# Patient Record
Sex: Male | Born: 1945 | Race: White | Hispanic: No | Marital: Married | Smoking: Former smoker
Health system: Southern US, Community
[De-identification: ages and names within clinical notes are randomized; demographics above are authoritative.]

## PROBLEM LIST (undated history)

## (undated) DIAGNOSIS — K5730 Diverticulosis of large intestine without perforation or abscess without bleeding: Secondary | ICD-10-CM

## (undated) DIAGNOSIS — I10 Essential (primary) hypertension: Secondary | ICD-10-CM

## (undated) DIAGNOSIS — Z9189 Other specified personal risk factors, not elsewhere classified: Secondary | ICD-10-CM

## (undated) DIAGNOSIS — Z87442 Personal history of urinary calculi: Secondary | ICD-10-CM

## (undated) DIAGNOSIS — Z8719 Personal history of other diseases of the digestive system: Secondary | ICD-10-CM

## (undated) DIAGNOSIS — I714 Abdominal aortic aneurysm, without rupture: Secondary | ICD-10-CM

## (undated) DIAGNOSIS — I7143 Infrarenal abdominal aortic aneurysm, without rupture: Secondary | ICD-10-CM

## (undated) DIAGNOSIS — R300 Dysuria: Secondary | ICD-10-CM

## (undated) DIAGNOSIS — N421 Congestion and hemorrhage of prostate: Secondary | ICD-10-CM

## (undated) DIAGNOSIS — Z8546 Personal history of malignant neoplasm of prostate: Secondary | ICD-10-CM

## (undated) DIAGNOSIS — N200 Calculus of kidney: Secondary | ICD-10-CM

## (undated) DIAGNOSIS — R3915 Urgency of urination: Secondary | ICD-10-CM

## (undated) HISTORY — PX: PERCUTANEOUS NEPHROSTOLITHOTOMY: SHX2207

## (undated) HISTORY — PX: OTHER SURGICAL HISTORY: SHX169

## (undated) HISTORY — PX: TONSILLECTOMY AND ADENOIDECTOMY: SUR1326

---

## 1998-06-11 ENCOUNTER — Ambulatory Visit (HOSPITAL_COMMUNITY): Admission: RE | Admit: 1998-06-11 | Discharge: 1998-06-11 | Payer: Self-pay | Admitting: Urology

## 1998-06-28 ENCOUNTER — Ambulatory Visit (HOSPITAL_COMMUNITY): Admission: RE | Admit: 1998-06-28 | Discharge: 1998-06-28 | Payer: Self-pay | Admitting: Urology

## 1998-07-11 ENCOUNTER — Ambulatory Visit (HOSPITAL_COMMUNITY): Admission: RE | Admit: 1998-07-11 | Discharge: 1998-07-11 | Payer: Self-pay | Admitting: Urology

## 1998-07-26 ENCOUNTER — Ambulatory Visit (HOSPITAL_COMMUNITY): Admission: RE | Admit: 1998-07-26 | Discharge: 1998-07-26 | Payer: Self-pay | Admitting: Urology

## 1998-07-26 ENCOUNTER — Encounter: Payer: Self-pay | Admitting: Urology

## 1998-09-09 ENCOUNTER — Encounter: Payer: Self-pay | Admitting: Urology

## 1998-09-09 ENCOUNTER — Ambulatory Visit (HOSPITAL_COMMUNITY): Admission: RE | Admit: 1998-09-09 | Discharge: 1998-09-09 | Payer: Self-pay | Admitting: Urology

## 1998-10-21 ENCOUNTER — Encounter: Payer: Self-pay | Admitting: Urology

## 1998-10-21 ENCOUNTER — Ambulatory Visit (HOSPITAL_COMMUNITY): Admission: RE | Admit: 1998-10-21 | Discharge: 1998-10-21 | Payer: Self-pay | Admitting: Urology

## 1998-11-07 ENCOUNTER — Encounter: Payer: Self-pay | Admitting: Urology

## 1998-11-07 ENCOUNTER — Ambulatory Visit (HOSPITAL_COMMUNITY): Admission: RE | Admit: 1998-11-07 | Discharge: 1998-11-07 | Payer: Self-pay | Admitting: Urology

## 1999-01-02 ENCOUNTER — Ambulatory Visit (HOSPITAL_COMMUNITY): Admission: RE | Admit: 1999-01-02 | Discharge: 1999-01-02 | Payer: Self-pay

## 1999-01-02 ENCOUNTER — Encounter: Payer: Self-pay | Admitting: Urology

## 1999-06-12 ENCOUNTER — Ambulatory Visit (HOSPITAL_COMMUNITY): Admission: RE | Admit: 1999-06-12 | Discharge: 1999-06-12 | Payer: Self-pay | Admitting: Urology

## 1999-07-10 ENCOUNTER — Ambulatory Visit (HOSPITAL_COMMUNITY): Admission: RE | Admit: 1999-07-10 | Discharge: 1999-07-10 | Payer: Self-pay | Admitting: Urology

## 1999-07-10 ENCOUNTER — Encounter: Payer: Self-pay | Admitting: Urology

## 1999-07-22 ENCOUNTER — Encounter: Payer: Self-pay | Admitting: Urology

## 1999-07-24 ENCOUNTER — Encounter: Payer: Self-pay | Admitting: Urology

## 1999-07-24 ENCOUNTER — Ambulatory Visit (HOSPITAL_COMMUNITY): Admission: RE | Admit: 1999-07-24 | Discharge: 1999-07-24 | Payer: Self-pay | Admitting: Urology

## 1999-08-01 ENCOUNTER — Encounter: Payer: Self-pay | Admitting: Urology

## 1999-08-01 ENCOUNTER — Ambulatory Visit (HOSPITAL_COMMUNITY): Admission: RE | Admit: 1999-08-01 | Discharge: 1999-08-01 | Payer: Self-pay | Admitting: Urology

## 2000-03-25 ENCOUNTER — Ambulatory Visit (HOSPITAL_COMMUNITY): Admission: RE | Admit: 2000-03-25 | Discharge: 2000-03-25 | Payer: Self-pay | Admitting: Urology

## 2000-03-25 ENCOUNTER — Encounter: Payer: Self-pay | Admitting: Urology

## 2000-03-30 ENCOUNTER — Ambulatory Visit (HOSPITAL_COMMUNITY): Admission: RE | Admit: 2000-03-30 | Discharge: 2000-03-30 | Payer: Self-pay | Admitting: Urology

## 2000-03-30 ENCOUNTER — Encounter: Payer: Self-pay | Admitting: Urology

## 2000-05-12 ENCOUNTER — Encounter: Payer: Self-pay | Admitting: Urology

## 2000-05-13 ENCOUNTER — Encounter: Payer: Self-pay | Admitting: Urology

## 2000-05-13 ENCOUNTER — Ambulatory Visit (HOSPITAL_COMMUNITY): Admission: RE | Admit: 2000-05-13 | Discharge: 2000-05-13 | Payer: Self-pay | Admitting: Urology

## 2000-05-27 ENCOUNTER — Encounter: Payer: Self-pay | Admitting: Urology

## 2000-05-27 ENCOUNTER — Ambulatory Visit (HOSPITAL_COMMUNITY): Admission: RE | Admit: 2000-05-27 | Discharge: 2000-05-27 | Payer: Self-pay | Admitting: Urology

## 2000-07-15 ENCOUNTER — Ambulatory Visit (HOSPITAL_COMMUNITY): Admission: RE | Admit: 2000-07-15 | Discharge: 2000-07-15 | Payer: Self-pay | Admitting: Urology

## 2000-07-15 ENCOUNTER — Encounter: Payer: Self-pay | Admitting: Urology

## 2000-09-15 ENCOUNTER — Encounter: Payer: Self-pay | Admitting: Urology

## 2000-09-15 ENCOUNTER — Ambulatory Visit (HOSPITAL_COMMUNITY): Admission: RE | Admit: 2000-09-15 | Discharge: 2000-09-15 | Payer: Self-pay | Admitting: Urology

## 2001-03-30 ENCOUNTER — Encounter: Payer: Self-pay | Admitting: Urology

## 2001-03-30 ENCOUNTER — Ambulatory Visit (HOSPITAL_COMMUNITY): Admission: RE | Admit: 2001-03-30 | Discharge: 2001-03-30 | Payer: Self-pay | Admitting: Urology

## 2001-05-17 ENCOUNTER — Encounter: Payer: Self-pay | Admitting: Urology

## 2001-05-19 ENCOUNTER — Ambulatory Visit (HOSPITAL_COMMUNITY): Admission: RE | Admit: 2001-05-19 | Discharge: 2001-05-19 | Payer: Self-pay | Admitting: Urology

## 2001-05-19 ENCOUNTER — Encounter: Payer: Self-pay | Admitting: Urology

## 2001-07-21 ENCOUNTER — Encounter: Payer: Self-pay | Admitting: Urology

## 2001-07-21 ENCOUNTER — Ambulatory Visit (HOSPITAL_COMMUNITY): Admission: RE | Admit: 2001-07-21 | Discharge: 2001-07-21 | Payer: Self-pay | Admitting: Urology

## 2001-08-18 ENCOUNTER — Encounter: Payer: Self-pay | Admitting: Urology

## 2001-08-22 ENCOUNTER — Encounter: Payer: Self-pay | Admitting: Urology

## 2001-08-22 ENCOUNTER — Ambulatory Visit (HOSPITAL_COMMUNITY): Admission: RE | Admit: 2001-08-22 | Discharge: 2001-08-22 | Payer: Self-pay | Admitting: Urology

## 2001-10-27 ENCOUNTER — Encounter: Payer: Self-pay | Admitting: Urology

## 2001-10-27 ENCOUNTER — Ambulatory Visit (HOSPITAL_COMMUNITY): Admission: RE | Admit: 2001-10-27 | Discharge: 2001-10-27 | Payer: Self-pay | Admitting: Urology

## 2001-11-10 ENCOUNTER — Ambulatory Visit (HOSPITAL_COMMUNITY): Admission: RE | Admit: 2001-11-10 | Discharge: 2001-11-10 | Payer: Self-pay | Admitting: Urology

## 2001-11-10 ENCOUNTER — Encounter: Payer: Self-pay | Admitting: Urology

## 2002-03-15 ENCOUNTER — Encounter: Payer: Self-pay | Admitting: Urology

## 2002-03-15 ENCOUNTER — Ambulatory Visit (HOSPITAL_COMMUNITY): Admission: RE | Admit: 2002-03-15 | Discharge: 2002-03-15 | Payer: Self-pay | Admitting: Urology

## 2002-03-23 ENCOUNTER — Ambulatory Visit (HOSPITAL_BASED_OUTPATIENT_CLINIC_OR_DEPARTMENT_OTHER): Admission: RE | Admit: 2002-03-23 | Discharge: 2002-03-23 | Payer: Self-pay | Admitting: Urology

## 2002-05-18 ENCOUNTER — Encounter: Payer: Self-pay | Admitting: Urology

## 2002-05-18 ENCOUNTER — Ambulatory Visit (HOSPITAL_BASED_OUTPATIENT_CLINIC_OR_DEPARTMENT_OTHER): Admission: RE | Admit: 2002-05-18 | Discharge: 2002-05-18 | Payer: Self-pay | Admitting: Urology

## 2002-07-24 ENCOUNTER — Encounter: Payer: Self-pay | Admitting: Urology

## 2002-07-27 ENCOUNTER — Encounter: Payer: Self-pay | Admitting: Urology

## 2002-07-27 ENCOUNTER — Ambulatory Visit (HOSPITAL_BASED_OUTPATIENT_CLINIC_OR_DEPARTMENT_OTHER): Admission: RE | Admit: 2002-07-27 | Discharge: 2002-07-27 | Payer: Self-pay | Admitting: Urology

## 2002-08-10 ENCOUNTER — Encounter: Payer: Self-pay | Admitting: Urology

## 2002-08-10 ENCOUNTER — Encounter: Admission: RE | Admit: 2002-08-10 | Discharge: 2002-08-10 | Payer: Self-pay | Admitting: Urology

## 2002-10-05 ENCOUNTER — Encounter: Payer: Self-pay | Admitting: Urology

## 2002-10-05 ENCOUNTER — Encounter: Admission: RE | Admit: 2002-10-05 | Discharge: 2002-10-05 | Payer: Self-pay | Admitting: Urology

## 2003-03-15 ENCOUNTER — Ambulatory Visit (HOSPITAL_BASED_OUTPATIENT_CLINIC_OR_DEPARTMENT_OTHER): Admission: RE | Admit: 2003-03-15 | Discharge: 2003-03-15 | Payer: Self-pay | Admitting: Urology

## 2003-03-15 ENCOUNTER — Encounter: Payer: Self-pay | Admitting: Urology

## 2003-05-10 ENCOUNTER — Ambulatory Visit (HOSPITAL_BASED_OUTPATIENT_CLINIC_OR_DEPARTMENT_OTHER): Admission: RE | Admit: 2003-05-10 | Discharge: 2003-05-10 | Payer: Self-pay | Admitting: Urology

## 2003-05-10 ENCOUNTER — Encounter: Payer: Self-pay | Admitting: Urology

## 2003-06-07 ENCOUNTER — Ambulatory Visit (HOSPITAL_BASED_OUTPATIENT_CLINIC_OR_DEPARTMENT_OTHER): Admission: RE | Admit: 2003-06-07 | Discharge: 2003-06-07 | Payer: Self-pay | Admitting: Urology

## 2003-06-07 ENCOUNTER — Encounter: Payer: Self-pay | Admitting: Urology

## 2003-07-05 ENCOUNTER — Ambulatory Visit (HOSPITAL_BASED_OUTPATIENT_CLINIC_OR_DEPARTMENT_OTHER): Admission: RE | Admit: 2003-07-05 | Discharge: 2003-07-05 | Payer: Self-pay | Admitting: Urology

## 2003-08-17 ENCOUNTER — Ambulatory Visit (HOSPITAL_COMMUNITY): Admission: RE | Admit: 2003-08-17 | Discharge: 2003-08-17 | Payer: Self-pay | Admitting: Urology

## 2003-08-17 ENCOUNTER — Ambulatory Visit (HOSPITAL_BASED_OUTPATIENT_CLINIC_OR_DEPARTMENT_OTHER): Admission: RE | Admit: 2003-08-17 | Discharge: 2003-08-17 | Payer: Self-pay | Admitting: Urology

## 2003-10-18 ENCOUNTER — Ambulatory Visit (HOSPITAL_COMMUNITY): Admission: RE | Admit: 2003-10-18 | Discharge: 2003-10-18 | Payer: Self-pay | Admitting: Urology

## 2004-01-17 ENCOUNTER — Ambulatory Visit (HOSPITAL_COMMUNITY): Admission: RE | Admit: 2004-01-17 | Discharge: 2004-01-17 | Payer: Self-pay | Admitting: Urology

## 2004-02-28 ENCOUNTER — Ambulatory Visit (HOSPITAL_COMMUNITY): Admission: RE | Admit: 2004-02-28 | Discharge: 2004-02-28 | Payer: Self-pay | Admitting: Urology

## 2004-09-25 ENCOUNTER — Ambulatory Visit (HOSPITAL_COMMUNITY): Admission: RE | Admit: 2004-09-25 | Discharge: 2004-09-25 | Payer: Self-pay | Admitting: Urology

## 2004-10-24 ENCOUNTER — Ambulatory Visit (HOSPITAL_BASED_OUTPATIENT_CLINIC_OR_DEPARTMENT_OTHER): Admission: RE | Admit: 2004-10-24 | Discharge: 2004-10-24 | Payer: Self-pay | Admitting: Urology

## 2004-11-20 ENCOUNTER — Ambulatory Visit (HOSPITAL_COMMUNITY): Admission: RE | Admit: 2004-11-20 | Discharge: 2004-11-20 | Payer: Self-pay | Admitting: Urology

## 2005-01-15 ENCOUNTER — Ambulatory Visit (HOSPITAL_COMMUNITY): Admission: RE | Admit: 2005-01-15 | Discharge: 2005-01-15 | Payer: Self-pay | Admitting: Urology

## 2005-02-04 HISTORY — PX: TRANSURETHRAL RESECTION OF PROSTATE: SHX73

## 2005-02-20 ENCOUNTER — Inpatient Hospital Stay (HOSPITAL_COMMUNITY): Admission: RE | Admit: 2005-02-20 | Discharge: 2005-02-22 | Payer: Self-pay | Admitting: Urology

## 2005-02-20 ENCOUNTER — Encounter (INDEPENDENT_AMBULATORY_CARE_PROVIDER_SITE_OTHER): Payer: Self-pay | Admitting: *Deleted

## 2005-02-22 ENCOUNTER — Emergency Department (HOSPITAL_COMMUNITY): Admission: EM | Admit: 2005-02-22 | Discharge: 2005-02-22 | Payer: Self-pay | Admitting: Emergency Medicine

## 2005-05-22 ENCOUNTER — Ambulatory Visit (HOSPITAL_COMMUNITY): Admission: RE | Admit: 2005-05-22 | Discharge: 2005-05-22 | Payer: Self-pay | Admitting: Urology

## 2005-05-22 ENCOUNTER — Ambulatory Visit (HOSPITAL_BASED_OUTPATIENT_CLINIC_OR_DEPARTMENT_OTHER): Admission: RE | Admit: 2005-05-22 | Discharge: 2005-05-22 | Payer: Self-pay | Admitting: Urology

## 2005-06-04 ENCOUNTER — Ambulatory Visit (HOSPITAL_COMMUNITY): Admission: RE | Admit: 2005-06-04 | Discharge: 2005-06-04 | Payer: Self-pay | Admitting: Urology

## 2005-09-23 IMAGING — CR DG CHEST 2V
2 series · 2 of 2 positions shown · non-contrast
Comparison: 03/15/03.

CLINICAL DATA: Preop for renal calculi. 
 CHEST TWO VIEW

[view not recorded (1 of 2)]
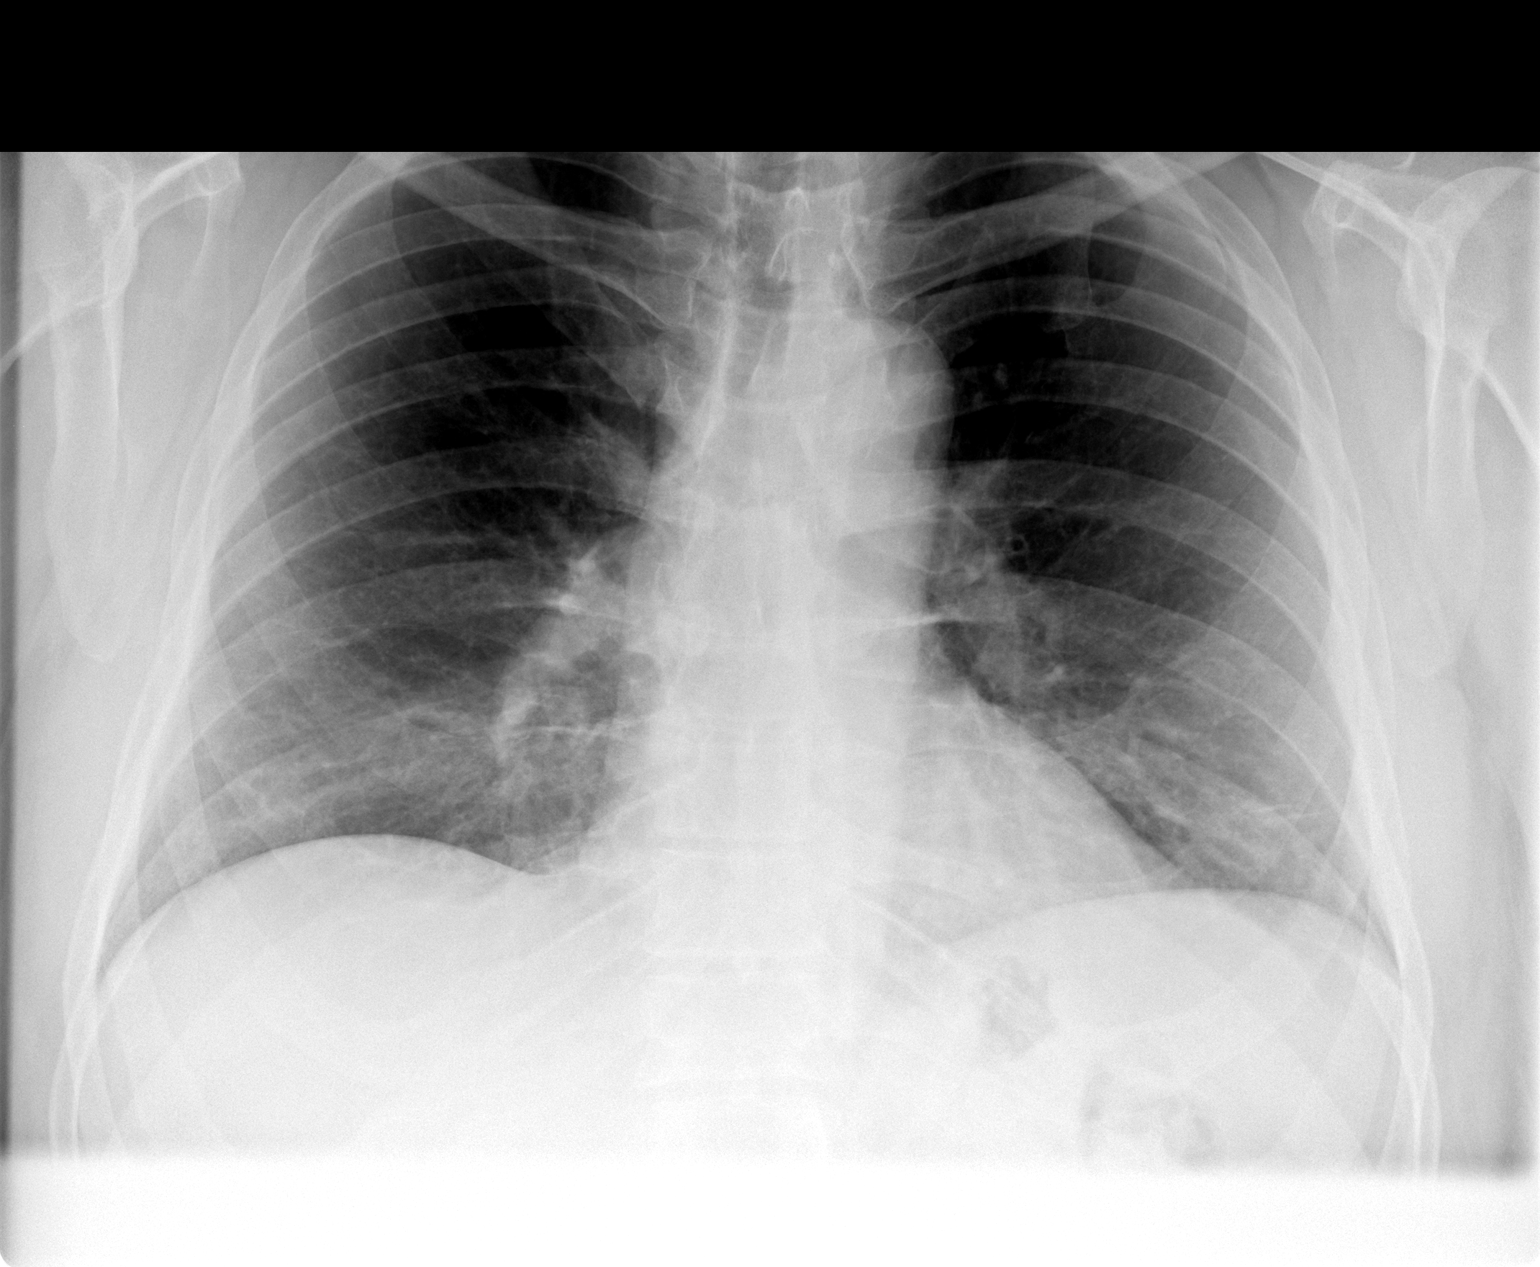

[view not recorded (2 of 2)]
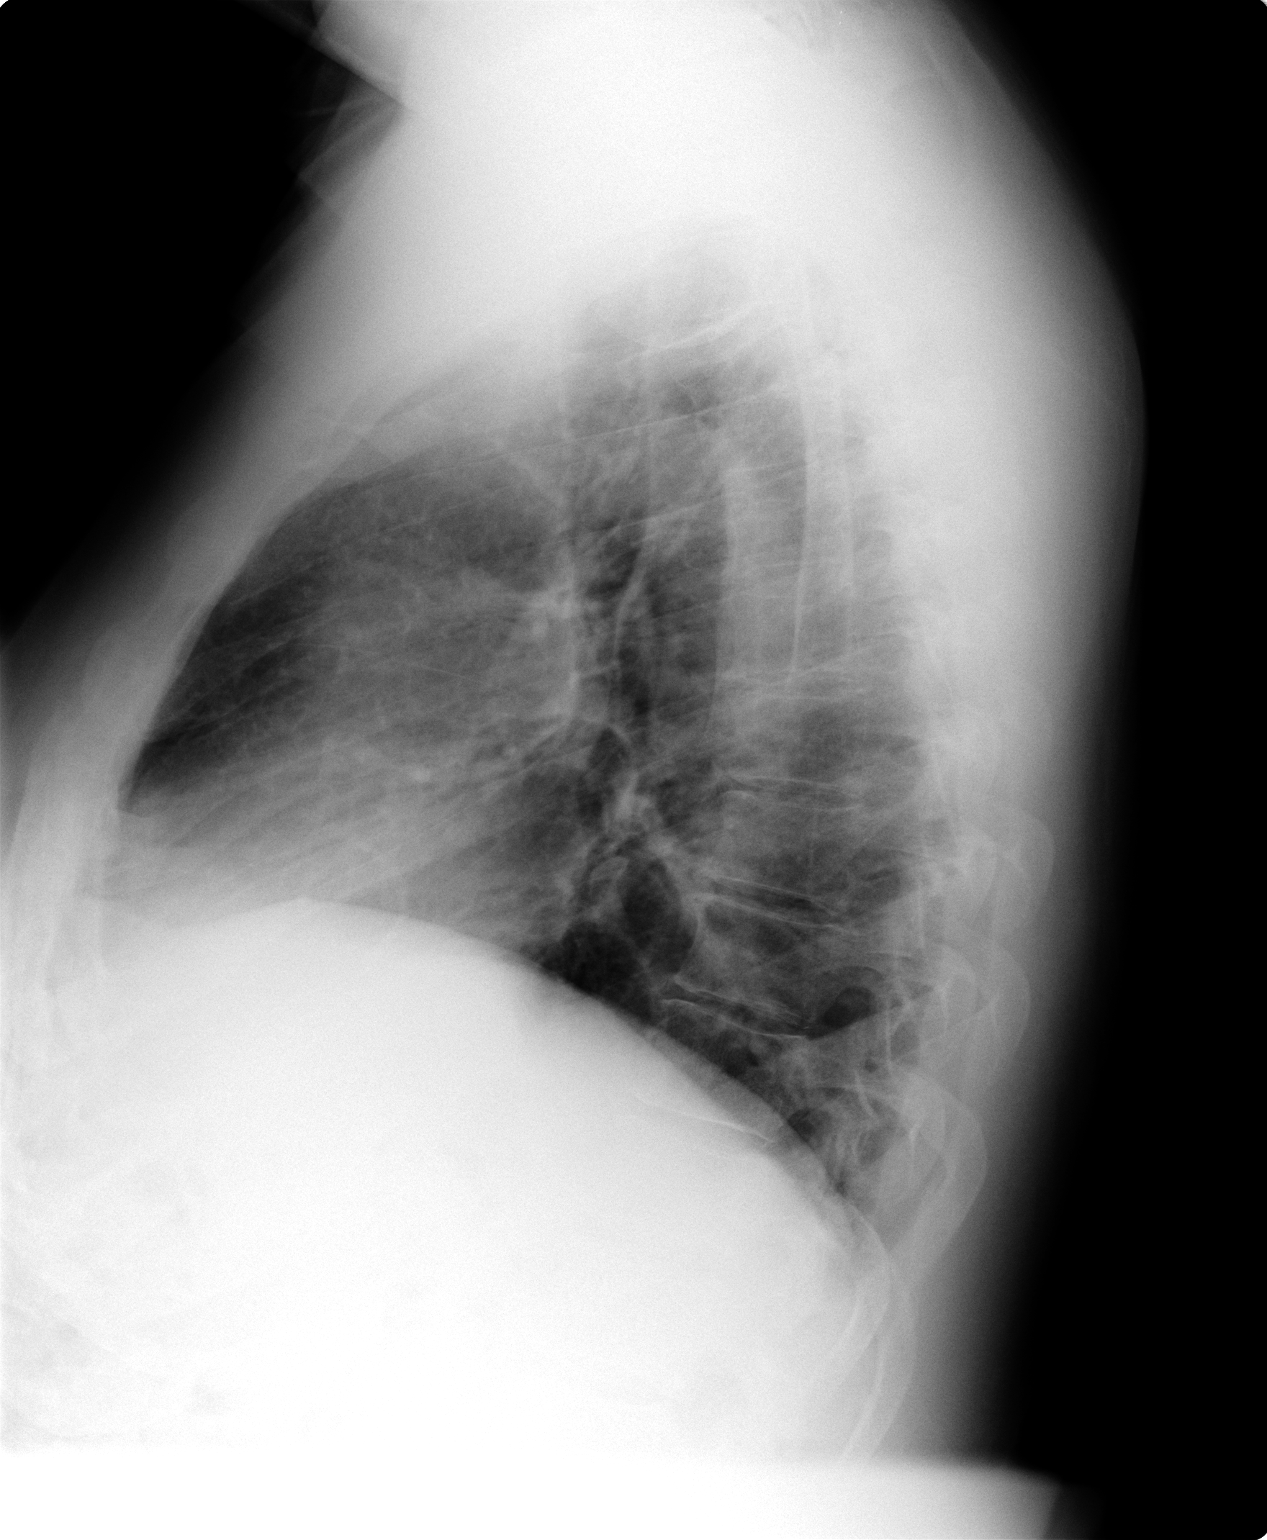

[2 of 2 positions shown; findings below may reference images not displayed]

Heart size is normal.  There is no heart failure.  There is mild bibasilar atelectasis / scarring.  There is no infiltrate or effusion.  
 IMPRESSION
 Bibasilar atelectasis / scarring similar to the prior study.  No acute abnormality.

## 2006-06-03 IMAGING — CR DG ABDOMEN 1V
3 series · 3 of 3 positions shown · non-contrast
Comparison: 05/22/05.

CLINICAL DATA: The patient had lithotripsy today with ureteral stents.
 ABDOMEN ? 1 VIEW:

[view not recorded (1 of 3)]
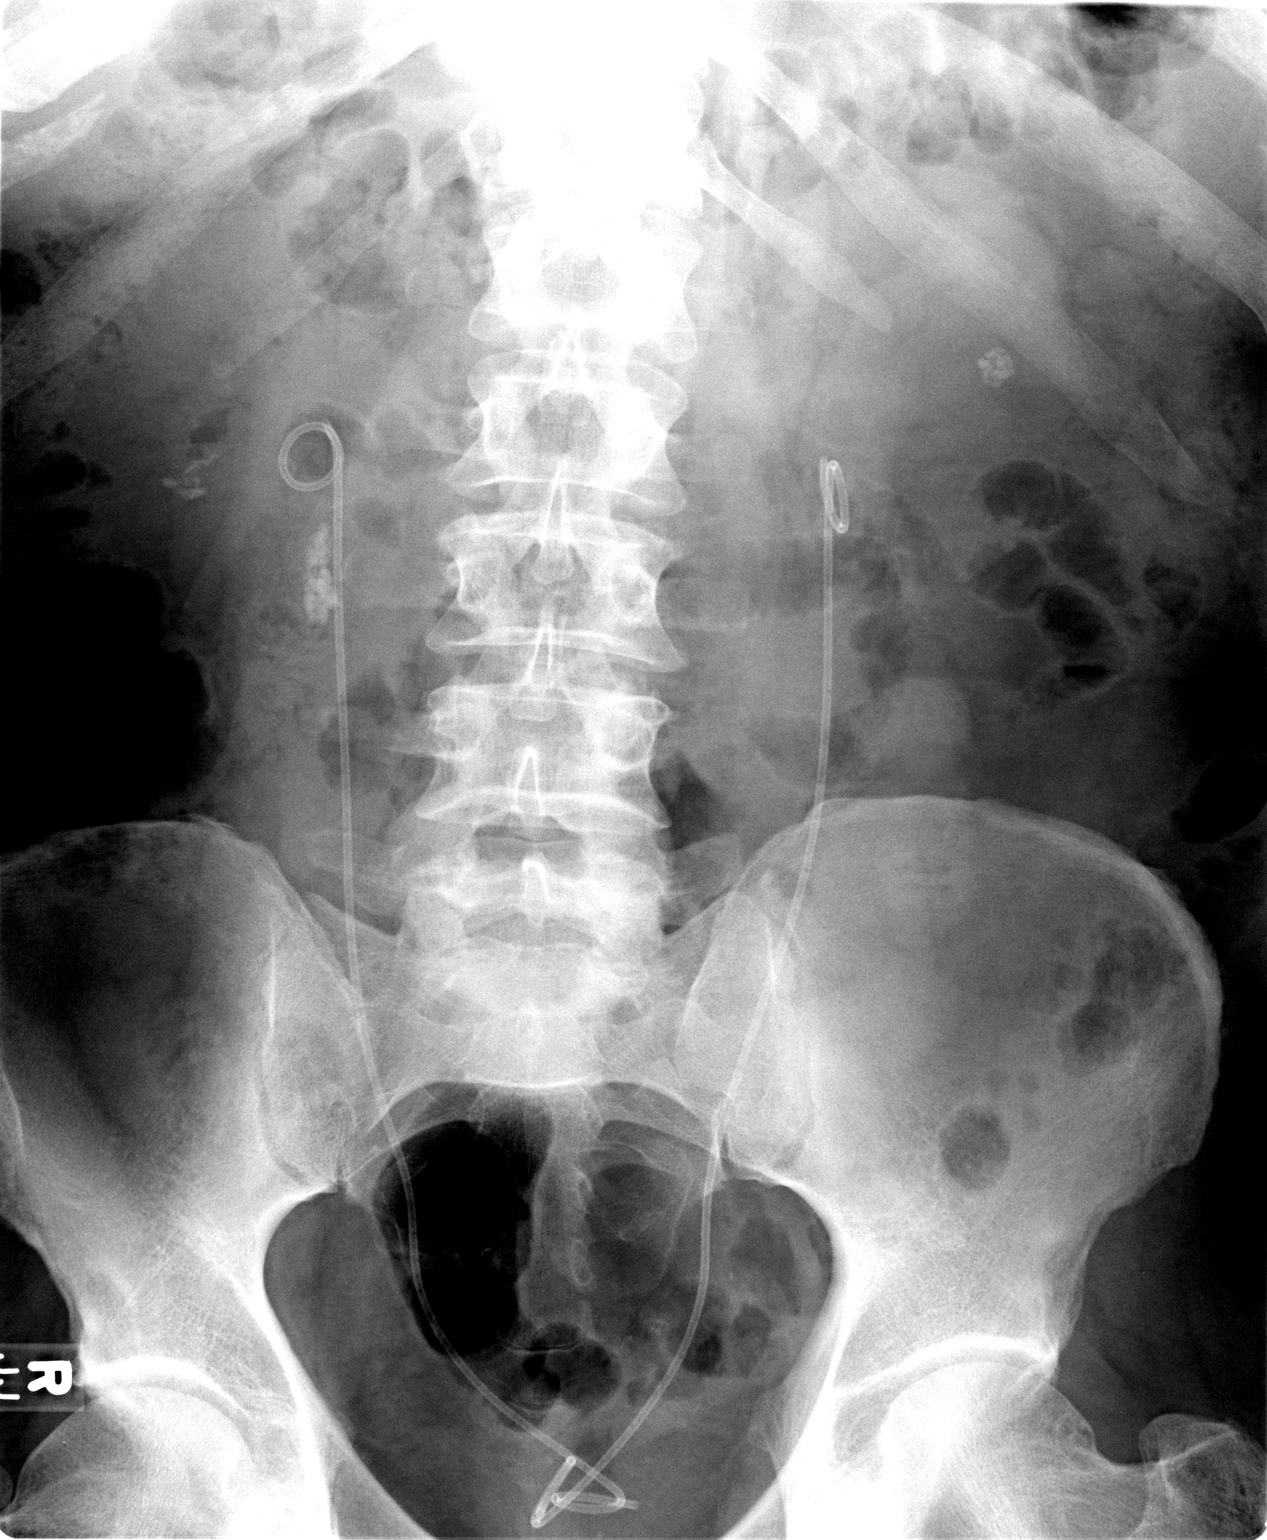

[view not recorded (2 of 3)]
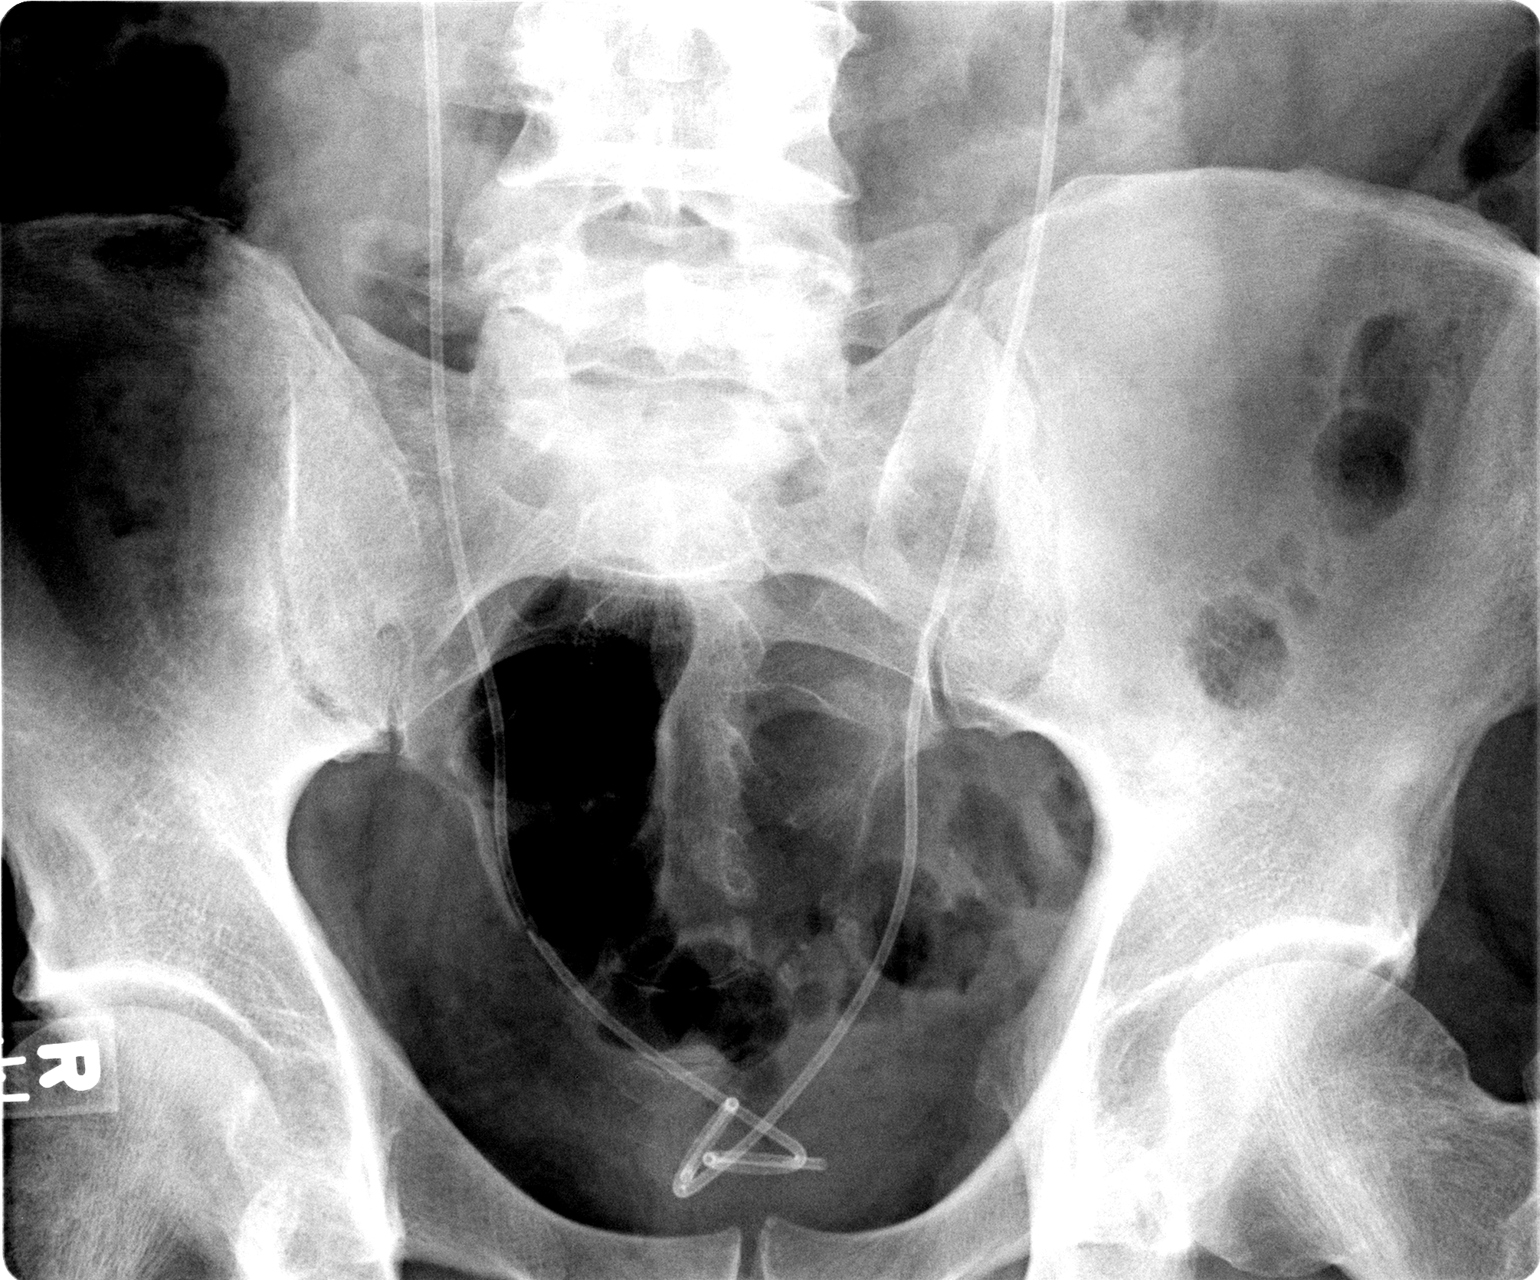

[view not recorded (3 of 3)]
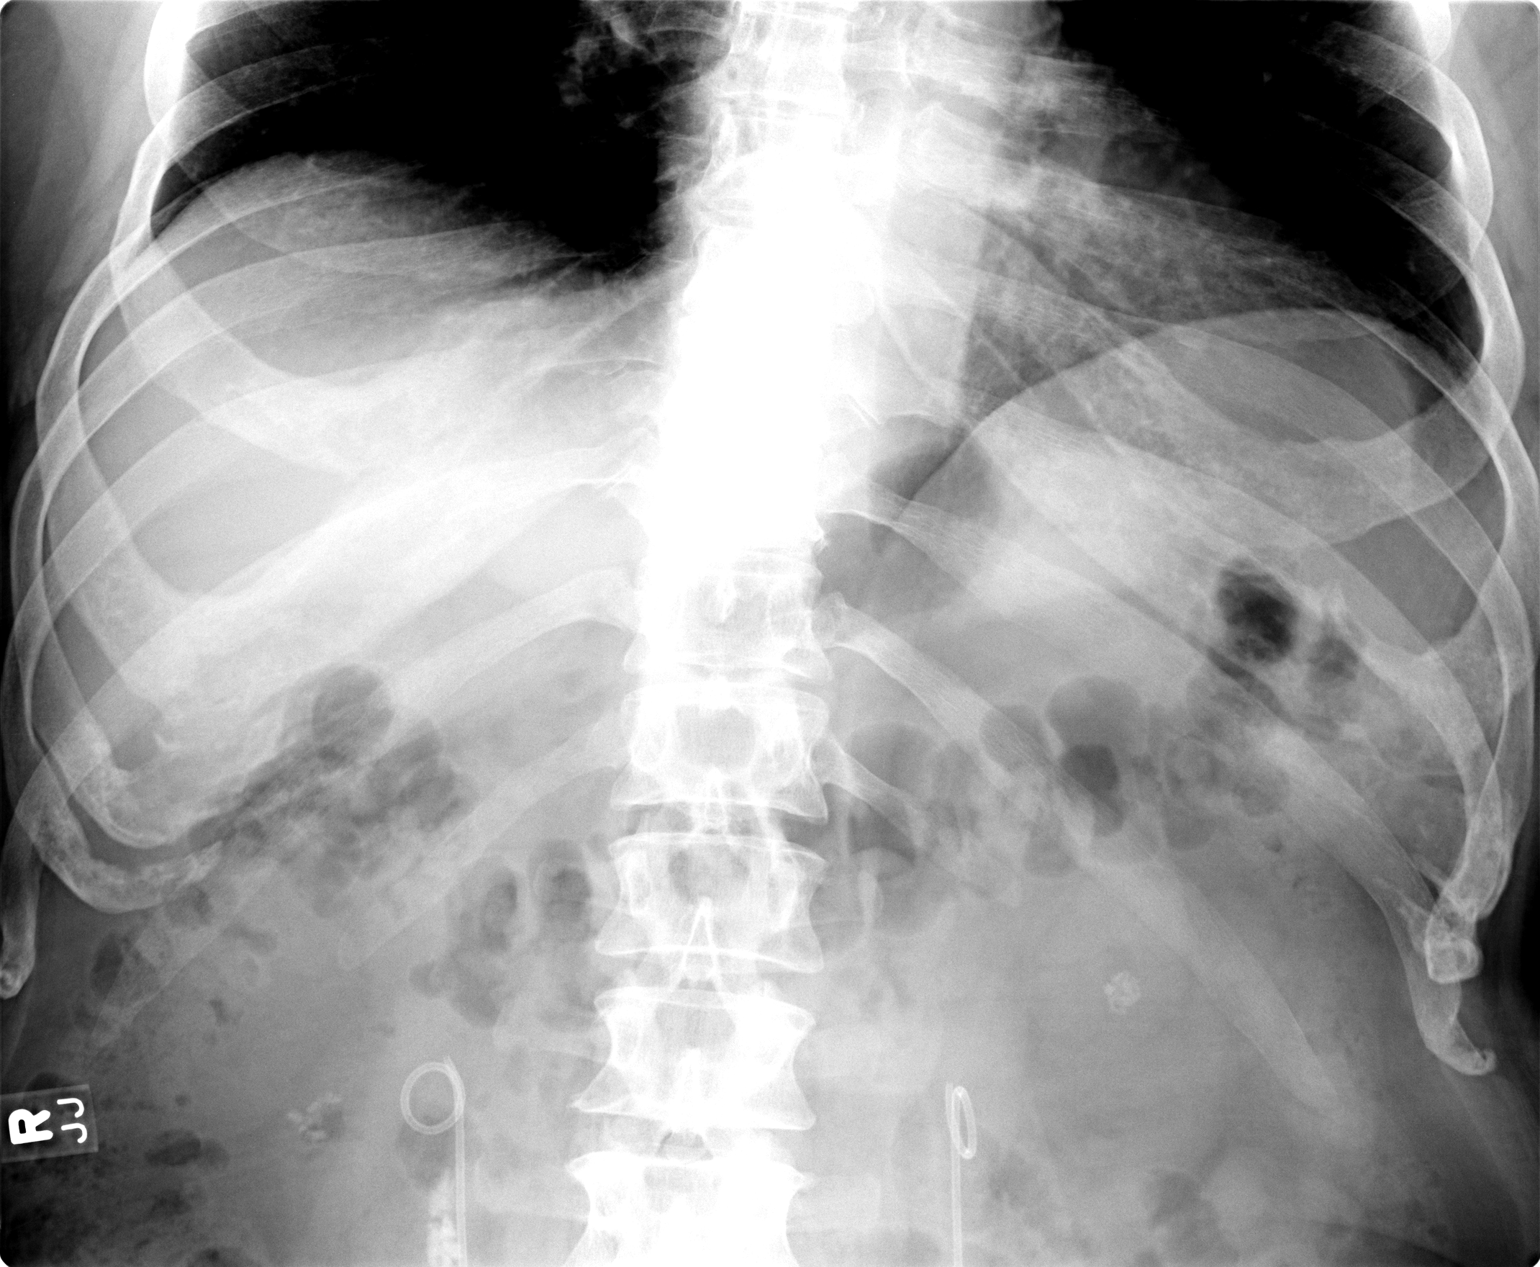

[3 of 3 positions shown; findings below may reference images not displayed]

FINDINGS: AP view of the abdomen reveals bilateral ureteral stents.  A cluster of calcifications are noted in the proximal right ureter.  This calcification was thought to be in the lower pole of the right kidney.  Calcifications remain in the left kidney.
IMPRESSION: Bilateral ureteral stents with a cluster of calcifications in the proximal right ureter.

## 2006-10-01 ENCOUNTER — Ambulatory Visit (HOSPITAL_BASED_OUTPATIENT_CLINIC_OR_DEPARTMENT_OTHER): Admission: RE | Admit: 2006-10-01 | Discharge: 2006-10-01 | Payer: Self-pay | Admitting: Urology

## 2008-01-30 HISTORY — PX: HEMICOLECTOMY: SHX854

## 2008-03-02 ENCOUNTER — Ambulatory Visit (HOSPITAL_COMMUNITY): Admission: RE | Admit: 2008-03-02 | Discharge: 2008-03-02 | Payer: Self-pay | Admitting: Urology

## 2011-04-21 NOTE — Op Note (Signed)
NAME:  Peter Jackson, Peter Jackson NO.:  [id number]   MEDICAL RECORD NO.:  [id number]          PATIENT TYPE:  AMB   LOCATION:  DAY                          FACILITY:  [facility]   PHYSICIAN:  Lindaann Slough, M.D.  DATE OF BIRTH:  [birth year]   DATE OF PROCEDURE:  03/02/2008  DATE OF DISCHARGE:                               OPERATIVE REPORT   PREOPERATIVE DIAGNOSIS:  Bladder stone.   POSTOPERATIVE DIAGNOSIS:  Bladder stone.   PROCEDURE:  1. Cystoscopy.  2. Electrohydraulic lithotripsy of bladder stone and stone extraction.   SURGEON:  Danae Chen, M.D.   ANESTHESIA:  General.   INDICATIONS:  The patient is 55 years of male who has a long history of  kidney stones.  For the past several weeks, he had been having flank  pain on and off.  About a week ago, he started having difficulty  voiding.  He was voiding a small amount of urine at the time and having  dribbling.  He came to the office on February 27, 2008.  He stated that he  thought that he had a stone in his bladder that was trying to pass, but  that it was blocking his urethra.  Cystoscopy in the office showed a  stone in the prostatic urethra that was pushed back in the bladder and  he continued to have the same symptoms.  He is scheduled today for EHL  of the bladder stone and stone extraction.   DESCRIPTION OF PROCEDURE:  Under general anesthesia, the patient was  prepped and draped and placed in the dorsal lithotomy position.  A #22  panendoscope was inserted in the urethra.  The anterior urethra was  normal.  There was a large stone in the prostatic urethra.  The patient  had previous TURP and the bladder neck was opened.  The stone was pushed  back in the bladder.  There was no tumor in the bladder.  The ureteral  orifices were in normal position and shape.  Then with the EHL probe,  the stone was fragmented.  Then the stone fragments were extracted with  the nitinol basket.  Then the bladder was irrigated with  normal saline  and smaller stone fragments were irrigated out.  There were some smaller  stone fragments left in the bladder, but they are small enough for the  patient to pass them spontaneously.  The bladder was then emptied and  the cystoscope removed.   He tolerated the procedure well and left the OR in satisfactory  condition to post anesthesia care unit.      Lindaann Slough, M.D.  Electronically Signed     MN/MEDQ  D:  03/02/2008  T:  03/03/2008  Job:  [id number]

## 2011-04-24 NOTE — Op Note (Signed)
NAME:  KEASTON, PILE NO.:  [id number]   MEDICAL RECORD NO.:  [id number]          PATIENT TYPE:  AMB   LOCATION:  NESC                         FACILITY:  [facility]   PHYSICIAN:  Mark C. Vernie Ammons, M.D.  DATE OF BIRTH:  07/06/1946   DATE OF PROCEDURE:  05/22/2005  DATE OF DISCHARGE:                                 OPERATIVE REPORT   PREOPERATIVE DIAGNOSIS:  Bilateral renal calculi.   POSTOPERATIVE DIAGNOSIS:  Bilateral renal calculi.   PROCEDURE:  Cystoscopy, bilateral retrograde pyelograms with interpretation,  bilateral ureteroscopy, bilateral stone extraction, and right in situ laser  lithotripsy of stones.   SURGEON:  Dr. Vernie Ammons.   ANESTHESIA:  General.   BLOOD LOSS:  Minimal.   DRAINS:  6-French 26 cm double-J stents in both right and left ureters (no  string).   SPECIMENS:  Stone to patient.   COMPLICATIONS:  None.   INDICATIONS:  The patient is a 66 year old white male with bilateral renal  calculi. He has had lithotripsy on the stones before. He wanted to proceed  with attempted ureteroscopic extraction rather than a percutaneous  procedure. We discussed the risks, complications and alternatives as well as  the limitations to this procedure, and he understands and elected to  proceed.   DESCRIPTION OF OPERATION:  After informed consent, the patient brought to  the major OR, placed on the table, administered general anesthesia, then  moved to the dorsal lithotomy position. His genitalia was sterilely prepped  and draped and initially, a 21-French cystoscope was introduced. The urethra  was noted be normal. The prostatic urethra recess was well resected, and the  bladder was free of any tumors or inflammatory lesions. On the bed of the  prostate was a single stone that was adherent to the prostatic urethra. This  was dislodged and pushed back in the bladder and later extracted.   The left orifice was identified, and a guidewire was passed up the  left  ureter under direct fluoroscopy. A ureteral access sheath was then placed up  the left ureter also under fluoroscopic control. I then passed the 6-French  flexible ureteroscope through the access sheath into the renal pelvis and  then first retroflexed the scope and was able to get it into the lower pole  where I was able to grasp multiple stones with a nitinol basket and extract  them one at that time, pulling each stone that I grasped out through the  access sheath leaving the access sheath in place. I performed this until I  had what appeared to be a nearly all of the stones. There may have been one  small fragment left, but there were some blood clots in that location, and I  could see no further stones at that time.   I then directed my attention to the stones in the mid pole. Due to his  anatomy and a very tight infundibulum which was demonstrated on retrograde  pyelogram, this was a very extremely difficult location to negotiate the  scope. I had injected contrast through the ureteroscope while it was in the  renal pelvis to  outline the anatomy of the renal pelvis and found that the  infundibulum portending the mid middle pole where the stones were located  curved upward and also was very narrow which would explain why the  fragmented stones did not pass. I used fluoroscopy as well as direct  visualization to negotiate the scope into the infundibulum and out into the  calix where the stones were located. I attempted to grasp the stones  multiple times but was unsuccessful to any significant degree. Since I was  unable to get any further stones from that infundibulum, I felt it was wise  to just place a stent at this point. I therefore removed the ureteroscope  and passed a guidewire through the access sheath, removing the access sheath  and then backloading the cystoscope. The double-J stent was then passed over  guidewire into the renal pelvis, and the guidewire was removed  with good  curl being noted in the renal pelvis and in the bladder.   An identical procedure was performed on the right side, first passing the  cystoscope and the guidewire then the access sheath and then finally the  ureteroscope through the access sheath. Again, I was able to pass the scope  up to the upper pole where fragments were located, and some were extracted  with great difficulty due to the anatomy. The infundibulum that the stones  were located in was off the side of the main upper pole calix at an acute  angle. I was able to extract some of the stone but not all that. I then  directed my attention to the lower pole and noted a single very large stone  and multiple small fragments in the lower pole upon retroflexion of the  scope. I attempted to engage the stone in the nitinol basket, but it was too  large. I therefore passed 200 micron holmium laser fiber through the  ureteroscope and fragmented the stone. I was able to grasp two portions of  it and pull it into the renal pelvis, but they were too large to extract. I  felt leaving them in the renal pelvis and placing a stent was the best  course at this point since I had been working for several hours and I did  not want to expose the patient to further fluoroscopy. I therefore placed a  double-J stent in the identical fashion that I did on the contralateral  side. I then drained the bladder, and the patient received a B&O  suppository. He was awakened and taken to the recovery in stable  satisfactory condition.   This procedure would be considered a complex procedure, requiring much  longer duration as well as extremely difficult due to the anatomy of this  patient's collecting system. Both of these factors made this procedure  complex, difficult, lengthy.   The patient will be given a prescription for Pyridium plus and Vicodin HP.  He will then follow-up my office in 1 week.      MCO/MEDQ  D:  05/22/2005  T:   05/22/2005  Job:  [id number]

## 2011-04-24 NOTE — Op Note (Signed)
NAME:  Peter Jackson, Peter Jackson NO.:  [id number]   MEDICAL RECORD NO.:  [id number]          PATIENT TYPE:  INP   LOCATION:  0003                         FACILITY:  [facility]   PHYSICIAN:  Lindaann Slough, M.D.  DATE OF BIRTH:  August 16, 1946   DATE OF PROCEDURE:  02/20/2005  DATE OF DISCHARGE:                                 OPERATIVE REPORT   PREOPERATIVE DIAGNOSES:  1.  Benign prostatic hypertrophy.  2.  Bladder outlet obstruction.  3.  Bladder calculi.   POSTOPERATIVE DIAGNOSES:  1.  Benign prostatic hypertrophy.  2.  Bladder outlet obstruction.  3.  Bladder calculi.   PROCEDURE DONE:  1.  Cystoscopy.  2.  TURP.  3.  Removal of bladder calculi.   SURGEON:  Dr. Brunilda Payor   ANESTHESIA:  General.   INDICATION:  The patient is a 65 year old male, who had had increasing  symptoms of frequency, urgency, hesitancy, and straining on urination.  He  was treated with Cardura; however, he continued to have the same symptoms.  He has had several ESL for renal calculi, and he has several stones in his  bladder, and he has been unable to pass them.  Cystoscopy showed trilobar  prostatic hypertrophy.  He is scheduled today for cystoscopy TURP, and  removal of bladder calculi.   Under general anesthesia, the patient was prepped and draped and placed in  the dorsal lithotomy position.  A #22 Wappler cystoscope was inserted in the  bladder.  The patient has trilobar prostatic hypertrophy with obstruction of  the bladder neck.  The bladder is trabeculated.  There are 2 stones in the  bladder.  The ureteral orifices are in normal position and shape with clear  efflux.  The cystoscope was removed.  The urethra was then dilated with #30  Hal Morales sounds.  Then, a #28 continuous-flow resectoscope was  inserted in the bladder.  Resection of the prostate gland was done between  the 5 o'clock and the 7 o'clock position, then between the 2 o'clock and the  5 o'clock position and between  the 7 and the 10 o'clock position using the  bladder neck and the verumontanum as landmarks.  Then the resection was  completed between the 10 o'clock and the 2 o'clock position using the same  landmarks.  Hemostasis was secured with electrocautery.  The prostatic chips  were then irrigated out of the bladder.  The resectoscope was then removed;  there was minimal bleeding at the end of the procedure.  With the  resectoscope at the verumontanum, the bladder neck was wide open.  The  resectoscope was then removed.  The cystoscope was reinserted in the  bladder, and there were some blood clots in the bladder that made it  difficult to break the stones with EHL.  The cystoscope was then removed.  The resectoscope was then reinserted in the bladder, and I was able to  remove the two bladder stones with the resectoscope through the urethra.  Hemostasis was then completed with  electrocautery.  There was minimal bleeding at the end of the procedure.  The resectoscope was  then removed.  A #24 Foley catheter was then inserted  in the bladder.   The patient tolerated the procedure well and left the OR in satisfactory  condition to postanesthesia care unit.      MN/MEDQ  D:  02/20/2005  T:  02/20/2005  Job:  [id number]

## 2011-04-24 NOTE — Op Note (Signed)
NAME:  Peter Jackson, Peter Jackson NO.:  [id number]   MEDICAL RECORD NO.:  [id number]          PATIENT TYPE:  AMB   LOCATION:  NESC                         FACILITY:  [facility]   PHYSICIAN:  Lindaann Slough, M.D.  DATE OF BIRTH:  August 27, 1946   DATE OF PROCEDURE:  10/01/2006  DATE OF DISCHARGE:                                 OPERATIVE REPORT   PREOPERATIVE DIAGNOSIS:  Right ureteral stones.   POSTOPERATIVE DIAGNOSIS:  Right ureteral stones.   PROCEDURE DONE:  1. Cystoscopy.  2. Right retrograde pyelogram.  3. Ureteroscopy.  4. Homium laser of ureteral stones and stone extraction.  5. Insertion of double-J catheter.   SURGEON:  Danae Chen, M.D.   ANESTHESIA:  General.   INDICATIONS:  The patient is a 65 year old male who has a long history of  kidney stones.  He has had multiple ESL and stone extractions in the past.  About 3 weeks ago he was having severe right flank pain; went to the  emergency room, and a CT scan showed bilateral renal stones and right  ureteral stones.  He was treated with Flomax and analgesics; however, he has  not passed any stone.  Repeat KUB shows several stones in the right distal  ureter.  KUB today shows that the stones are seen in the distal ureter, and  he is scheduled for stone manipulation.   Under general anesthesia the patient was prepped and draped and placed in  the dorsal lithotomy position.  A #22 Wappler cystoscope was inserted in the  bladder.  The patient had previous TURP and the bladder neck is open.  The  bladder mucosa is normal.  There is no stone or tumor in the bladder.  A  sensor tip guidewire was passed through the cystoscope and passed in the  ureter up to the renal pelvis.   RETROGRADE PYELOGRAM:  An open-ended catheter was passed over the guidewire  into the distal ureter and the guidewire was removed.  Contrast was then  injected through the open-ended catheter.  There were several stones in the  distal ureter, and  the proximal ureter was moderately dilated.  The renal  pelvis and collecting system are normal.  The sensor tip guidewire was then  passed through the open-ended catheter, and the open-ended catheter was  removed.  The cystoscope was removed.   A 6.5-French rigid ureteroscope was passed in the bladder and in the distal  ureter without difficulty.  There were several stones present in the distal  ureter.  With #365 microfiber holmium laser, the stones were fragmented in  multiple stone fragments.  The stone fragments were then removed out of the  ureter with the Nitinol  stone basket and dropped in the bladder.  All stone  fragments were removed out of the ureter.   SECOND RETROGRADE PYELOGRAM:  The ureteroscope was then reinserted in the  ureter.  Contrast was then injected through the ureteroscope; there was no  evidence of filling defect in the ureter and there was no evidence of  extravasation of contrast.  The ureteroscope was then removed.   The  guidewire was back loaded into the cystoscope, and a #6-French-26  contour  double-J catheter was passed over the guidewire.  The proximal curl  of the double-J catheter is in the renal pelvis.  The distal curl is in the  bladder.  The string  was left attached to the double-J catheter.  The  guidewire was removed.  The bladder was then irrigated with a Toomey  syringe,  and the stone fragments were extracted.  The bladder was then  emptied and the cystoscope removed.   The patient tolerated the procedure well and left the OR in satisfactory  condition to post anesthesia care unit.      Lindaann Slough, M.D.  Electronically Signed     MN/MEDQ  D:  10/01/2006  T:  10/03/2006  Job:  [id number]

## 2011-04-24 NOTE — Op Note (Signed)
[facility]  Patient:    Peter Jackson, ROUTE Visit Number: [id number] MRN: [id number]          Service Type: NES Location: NESC Attending Physician:  Lindaann Slough Dictated by:   Lindaann Slough, M.D. Proc. Date: 03/23/02 Admit Date:  03/23/2002                             Operative Report  PREOPERATIVE DIAGNOSES:  Bladder stone and bilateral renal stones.  POSTOPERATIVE DIAGNOSES:  Bladder stone and bilateral renal stones.  PROCEDURE:  Cystoscopy, electrohydraulic lithotripsy bladder stone right retrograde pyelogram and ureteroscopy.  SURGEON:  Lindaann Slough, M.D.  ANESTHESIA:  General.  INDICATIONS FOR PROCEDURE:  The patient is a 65 year old male who has a history of kidney stones. He had several little ______ of both kidney stones in the past. KUB showed a stone in the bladder and the stone has increased in size during the past two months. He is scheduled for cystoscopy, electrohydraulic lithotripsy bladder stone and ureteroscopy.  DESCRIPTION OF PROCEDURE:  Under general anesthesia, the patient was prepped and draped and placed in the dorsal lithotomy position. A normal 21 Wappler cystoscope was inserted in the bladder. The patient has trilobar prostatic hypertrophy. The bladder is moderately trabeculated. There is a stone in the bladder. With the electrohydraulic lithotripsy probe, the stone was fragmented in numerous small fragments and the stone fragments were extracted. There was no evidence of remaining stone fragments in the bladder at the end of the cystoscopy. Then a cone tip catheter was passed through the cystoscope into the right ureter. The ureter is normal. There is a filling defect in the upper pole calix of the right kidney. The cone tip catheter was then removed. A guidewire was then passed through the cystoscope into the right ureter. The cystoscope was removed. A ureteroscope access sheath was passed over  the guidewire and then a flexible ureteroscope was passed through the ureteroscope access sheath into the upper pole calix of the kidney. The calix is an angle and the stone is really seen in that calix. A holmium laser fiber was then passed through the ureteroscope but because of the angle, the fiber could not be passed through the ureteroscope into the calix. The ureteroscope and the holmium laser fiber were removed. A guidewire was then passed through the ureteroscope access sheath and the ureteroscope access sheath was removed. The guidewire was then back loaded into the cystoscope and a #6 - 26 double J catheter was passed over the guidewire. The proximal coil of the double J catheter is in the renal pelvis. The distal coil is in the bladder. The bladder was then examined and there was no evidence of stone fragment in the bladder. The bladder was then emptied and the cystoscope removed.  The patient tolerated the procedure well and left the OR in satisfactory condition to post anesthesia care unit. Dictated by:   Lindaann Slough, M.D. Attending Physician:  Lindaann Slough DD:  03/23/02 TD:  03/24/02 Job: [id number]

## 2011-04-24 NOTE — Consult Note (Signed)
NAME:  Peter Jackson, Peter Jackson NO.:  [id number]   MEDICAL RECORD NO.:  [id number]            PATIENT TYPE:   LOCATION:                                 FACILITY:   PHYSICIAN:  Valetta Fuller, M.D.       DATE OF BIRTH:   DATE OF CONSULTATION:  DATE OF DISCHARGE:                                   CONSULTATION   SUBJECTIVE:  The patient had presented to the emergency room with inability  to void, status post discharge earlier today.  The patient had undergone  transurethral resection of the prostate by Dr. Brunilda Payor.  The catheter had been  removed this morning.  He was voiding relatively well with relatively clear  urine.  For that reason, he was discharged to home but upon getting home he  was unable to void.  He represented to the emergency room where they  contacted me.  I replaced a Foley catheter and irrigated a few small clots.  His urine was then a very light pink color and was draining well.  We  decided to send him home with the indwelling catheter with instructions to  call Dr. Brunilda Payor the following day for reevaluation.       ___________________________________________  Valetta Fuller, M.D.    DSG/MEDQ  D:  03/06/2005  T:  03/06/2005  Job:  [id number]

## 2011-04-24 NOTE — H&P (Signed)
NAME:  Peter Jackson, Peter Jackson NO.:  [id number]   MEDICAL RECORD NO.:  [id number]          PATIENT TYPE:  INP   LOCATION:  0003                         FACILITY:  [facility]   PHYSICIAN:  Lindaann Slough, M.D.  DATE OF BIRTH:  Dec 23, 1945   DATE OF ADMISSION:  02/20/2005  DATE OF DISCHARGE:                                HISTORY & PHYSICAL   CHIEF COMPLAINT:  Frequency, hesitancy, decreased strength.   HISTORY OF PRESENT ILLNESS:  The patient is a 65 year old male who has had  increasing symptoms of frequency, hesitancy, decreased stream, and straining  on urination.  He has been treated with Cardura but he still has same  symptoms.  He also has had multiple ESL for renal calculi and he has  multiple stone fragments that he cannot pass because of his enlarged  prostate.  He also had removal of bladder calculi in the past.  Cystoscopy  showed trilobar prostatic hypertrophy with trabeculated bladder.  Patient is  scheduled today for TURP and removal of bladder calculi.   PAST MEDICAL HISTORY:  1.  Kidney stone.  2.  Hypertension.   MEDICATIONS:  1.  Cardura.  2.  Lipitor 40 mg h.s.  3.  Lisinopril 20 mg h.s.   ALLERGIES:  No known drug allergies.   PAST SURGICAL HISTORY:  1.  ESL.  2.  Cystolitholapaxy.   SOCIAL HISTORY:  He is married.  Has smoked for several years and does not  drink.   FAMILY HISTORY:  His father died of heart disease at age 5.  His mother  died of cancer, primary unknown, at age 31.   REVIEW OF SYSTEMS:  He has no cough.  No shortness of breath.  No  hemoptysis.  No palpitation.  No chest pain.  No nausea.  No vomiting.  No  diarrhea.  No constipation.  GU:  As per history.   PHYSICAL EXAMINATION:  GENERAL:  This is a well built 65 year old male in no  acute distress.  VITAL SIGNS:  Blood pressure 134/80, pulse 68, respirations 16, temperature  97.7.  HEENT:  Normal.  Pupils are equal and reactive to light and accommodation.  Ears, nose,  and throat within normal limits.  NECK:  Supple.  No cervical lymph nodes.  No thyromegaly.  CHEST:  Symmetrical.  Lungs are fully expanded and clear to percussion and  auscultation.  HEART:  Regular rhythm.  No murmur.  No gallops.  ABDOMEN:  Soft.  Nondistended, nontender.  He has no CVA tenderness.  No  hepatomegaly.  No splenomegaly.  Bladder is not distended.  He has no  inguinal hernia.  Bowel sounds are normal.  GENITOURINARY:  Penis is circumcised.  Meatus is normal.  Scrotum is normal.  He has no hydrocele.  No testicular mass.  Cords and epididymes are within  normal limits.  EXTREMITIES:  Within normal limits.  No pedal edema.  No deformities.  Good  peripheral pulses.  RECTAL:  He has no external hemorrhoids.  Sphincter tone is normal.  Prostate is enlarged at 50 g.  No nodules.  Seminal vesicals not palpable.  ADMITTING DIAGNOSES:  1.  Benign prostatic hypertrophy.  2.  Bladder outlet obstruction.  3.  Bladder calculi.  4.  Renal calculi.  5.  Hypertension.      MN/MEDQ  D:  02/20/2005  T:  02/20/2005  Job:  [id number]

## 2011-04-24 NOTE — Op Note (Signed)
NAME:  Peter Jackson, MCEUEN NO.:  [id number]   MEDICAL RECORD NO.:  [id number]          PATIENT TYPE:  AMB   LOCATION:  [facility]                         FACILITY:  [facility]   PHYSICIAN:  Lindaann Slough, M.D.  DATE OF BIRTH:  [birth year]   DATE OF PROCEDURE:  10/24/2004  DATE OF DISCHARGE:                                 OPERATIVE REPORT   PREOPERATIVE DIAGNOSES:  1.  Left ureteral calculi.  2.  Bilateral renal calculi.  3.  Bladder calculi.   POSTOPERATIVE DIAGNOSES:  1.  Left ureteral calculi.  2.  Bilateral renal calculi.  3.  Bladder calculi.   PROCEDURE:  Cystoscopy, ureteroscopy, holmium laser of left ureteral calculi  and bladder calculi and insertion of double J catheter.   SURGEON:  Lindaann Slough, M.D.   ANESTHESIA:  General.   INDICATIONS FOR PROCEDURE:  The patient is a 65 year old male with a long  history of renal calculi.  He most recently had an ESL of a left upper renal  calculus in October 2005.  He has been having left flank pain for the past  several days and this morning started having severe pain and a KUB shows  multiple stone fragments in the left distal ureter and bladder calculi.  He  is scheduled for cystoscopy, ureteroscopy, holmium laser of the ureteral and  bladder calculi.   Under general anesthesia, the patient was prepped and draped and placed in  the dorsal lithotomy position.  A #22 Wappler cystoscope was inserted in the  bladder.  The patient has trilobar prostatic hypertrophy. There are two  moderate size calculi in the bladder.  There is no tumor in the bladder. The  ureteral orifices are in normal position and shape. A Glidewire was then  passed through the cystoscope into the left ureter through an open end  catheter then the cystoscope and the open end catheter were removed.  A 6.5  French rigid ureteroscope was then passed in the bladder and then through  the ureter.  Multiple stone fragments were visualized in the ureter.  The  stone fragments were fragmented in multiple smaller fragments and the stone  fragments were extracted out of the ureter with nitinol stone basket.  The  stone fragments were then dropped in the bladder.  Then the ureteroscope was  removed.  A double J catheter was then passed over the Glidewire, the  proximal curl of the double J catheter is in the renal pelvis, the distal  curl is in the bladder. The Glidewire was then removed.  The bladder calculi  were then fragmented with the holmium laser and the stone fragments were  irrigated out of the bladder. Some stone fragments were removed with the  nitinol basket.  All larger stone fragments were removed out of the ureter  and the bladder.  Some tiny fragments were left in the ureter and the  patient will probably pass those stone fragments without difficulty.   The bladder was then emptied and the cystoscope removed.   The patient tolerated the procedure well and left the OR in satisfactory  condition to post anesthesia  care unit.      MN/MEDQ  D:  10/24/2004  T:  10/25/2004  Job:  [id number]

## 2011-04-24 NOTE — Op Note (Signed)
   NAME:  Peter Jackson, Peter Jackson                       ACCOUNT NO.:  [id number]   MEDICAL RECORD NO.:  [id number]                   PATIENT TYPE:  AMB   LOCATION:  [facility]                                 FACILITY:  [facility]   PHYSICIAN:  Lindaann Slough, M.D.               DATE OF BIRTH:  Oct 14, 1946   DATE OF PROCEDURE:  08/17/2003  DATE OF DISCHARGE:                                 OPERATIVE REPORT   PREOPERATIVE DIAGNOSES:  1. Bladder stones.  2. Bilateral renal calculi.   POSTOPERATIVE DIAGNOSES:  1. Bladder stones.  2. Bilateral renal calculi.   PROCEDURE DONE:  1. Cystoscopy.  2. Electrohydraulic lithotripsy bladder stones.   SURGEON:  Lindaann Slough, M.D.   ANESTHESIA:  General.   INDICATION:  The patient is a 65 year old male, who had had several ESL of  renal calculi.  He has multiple stones in the bladder and has been unable to  pass them.  He is also known to have prostatic hypertrophy.  He is scheduled  today for electrohydraulic lithotripsy of bladder stones.   DESCRIPTION OF PROCEDURE:  Under general anesthesia, the patient was prepped  and draped and placed in the dorsal lithotomy position.  A #22 Wappler  cystoscope was inserted in the bladder.  Patient has trilobar prostatic  hypertrophy.  There was one stone in the prostatic urethra that was pushed  back into the bladder.  He also has three fairly large stones in the  bladder.  There is no tumor in the bladder.  The ureteral orifices are in  normal position and shape with clear efflux.  Then with the EHL probe, the  bladder stones were fragmented in multiple small fragments, and the  fragments were irrigated out of the bladder.  There was no evidence of  remaining stone fragment in the bladder at the end of the procedure, and  there was no evidence of hemorrhage.  The bladder was then emptied and the  cystoscope removed.   The patient tolerated the procedure well and left the OR in satisfactory  condition to  postanesthesia care unit.                                               Lindaann Slough, M.D.    MN/MEDQ  D:  08/17/2003  T:  08/17/2003  Job:  [id number]

## 2011-04-24 NOTE — Discharge Summary (Signed)
NAME:  Peter Jackson, LINHARES NO.:  [id number]   MEDICAL RECORD NO.:  [id number]          PATIENT TYPE:  INP   LOCATION:  0380                         FACILITY:  [facility]   PHYSICIAN:  Lindaann Slough, M.D.  DATE OF BIRTH:  09-13-1946   DATE OF ADMISSION:  02/20/2005  DATE OF DISCHARGE:  02/22/2005                                 DISCHARGE SUMMARY   DISCHARGE DIAGNOSES:  1.  Adenocarcinoma of the prostate.  2.  Bilateral renal calculi.  3.  Bladder calculi.   PROCEDURE:  Cystoscopy and removal of bladder calculi on February 20, 2005.   Patient is a 65 year old male who had increasing symptoms of frequency,  hesitancy, decreased stream and straining on urination.  He had been treated  with Cardura, but he was still having the same symptoms.  He has a history  of kidney stones and had multiple ESL, and he has multiple stone fragments  in the bladder.  On February 20, 2005, he had cystoscopy, TURP, and removal of  bladder calculi.  He was admitted after the procedure.   PHYSICAL EXAMINATION:  VITAL SIGNS:  Blood pressure 124/80, pulse 68,  respirations 16, temperature 97.7.  HEENT:  Head is normal.  Pupils are equal and reactive to light and  accommodation.  LUNGS:  Lungs are clear to auscultation and percussion.  HEART:  Regular rhythm.  No murmur.  No gallops.  ABDOMEN:  Soft, nontender, nondistended.  No CVA tenderness.  No  hepatomegaly.  No splenomegaly.  Bladder not distended.  No inguinal hernia.  Bowel sounds normal.  GENITOURINARY:  Penis is circumcised.  Meatus is normal.  Scrotum is normal.  Cords and epididymis are within normal limits.  RECTAL:  Sphincter tone is normal.  Prostate is enlarged at 50 gm.  No  nodules or seminal vesicles palpable.   The patient had a TURP and removal of bladder calculi on February 20, 2005.  Postop course was uneventful.  He remained afebrile.  Foley catheter was  draining clear urine.  The catheter was removed on the second day  postop.   After removing the Foley, the patient was voiding well.  He was then  discharged home on Cipro 250 mg twice daily, Pyridium Plus 1 tablet 3 times  a day, Percocet 5/325 1-2 tablets every 4 hours p.r.n. pain.   On the evening of March 04, 2005, the patient returned to the emergency room  for clots urinary retention.  The Foley catheter was then reinserted into  the bladder and left indwelling.  The catheter was then removed in the  office.  After removing the Foley, he was voiding well.  His urine was  clear.   Pathology report showed adenocarcinoma of the prostate, Gleason score 6,  involving less than 5% of the tissues.   TREATMENT OPTIONS:  The treatment options were discussed with the patient.  Option 1:  Expectant management.  Option 2:  Radical prostatectomy.  Option 3:  External beam.  Option 4:  Cryoablation.   The risks and benefits of each option were discussed with the patient.  He  will discuss  this with his wife and let me know of his decision.   CONDITION ON DISCHARGE:  Improved.   DISCHARGE DIET:  Regular.   Patient will be followed in the office.      MN/MEDQ  D:  03/06/2005  T:  03/06/2005  Job:  [id number]

## 2011-06-07 DEATH — deceased

## 2011-08-31 LAB — BASIC METABOLIC PANEL
BUN: 19
CO2: 30
Calcium: 9.2
Chloride: 102
Creatinine, Ser: 0.93
GFR calc Af Amer: >60
Glucose, Bld: 101 — ABNORMAL HIGH

## 2011-12-02 ENCOUNTER — Other Ambulatory Visit: Payer: Self-pay | Admitting: Urology

## 2011-12-16 ENCOUNTER — Encounter (HOSPITAL_BASED_OUTPATIENT_CLINIC_OR_DEPARTMENT_OTHER): Payer: Self-pay | Admitting: *Deleted

## 2011-12-16 NOTE — Progress Notes (Signed)
NPO AFTER MN. ARRIVES AT 0730. NEEDS ISTAT AND EKG. WILL TAKE ATENOLOL AM OF SURG. W/ SIP OF WATER.

## 2011-12-17 NOTE — H&P (Signed)
History of Present Illness  Peter Jackson presents today with history of difficulty voiding for the past couple weeks.  He has a past history of kidney stone.  He had TURP in the past.  It feels like a bladder stone to him. He denies dysuria or hematuria.   Past Medical History Problems  1. History of  Bladder Calculus V13.01 2. History of  Hypertension 401.9 3. History of  Prostate Cancer V10.46 4. History of  Prostate Cancer V10.46  Surgical History Problems  1. History of  Cystoscopy With Fragmentation Of Bladder Calculus  Current Meds 1. Atenolol-Chlorthalidone 50-25 MG Oral Tablet; Therapy: 29Apr2012 to 2. Beelith 362-20 MG Oral Tablet; TAKE 1 TABLET Twice daily; Therapy: 13Nov2012 to  (Evaluate:08Nov2013)  Requested for: 13Nov2012; Last Rx:13Nov2012 3. Beelith TABS; Therapy: (Recorded:11Feb2008) to 4. Cardura TABS; Therapy: (Recorded:10Dec2007) to 5. Indapamide TABS; Therapy: (Recorded:11Feb2008) to 6. Potassium Citrate ER 10 MEQ (1080 MG) Oral Tablet Extended Release; Therapy:  (Recorded:11Feb2008) to 7. Tamsulosin HCl 0.4 MG Oral Capsule; Therapy: 08Dec2011 to  Allergies Medication  1. Keflex TABS  Family History Problems  1. Family history of  Family Health Status Number Of Children 1 son  Social History Problems  1. Marital History - Currently Married 2. Occupation: Interior and spatial designer of operations 3. Tobacco Use V15.82 smoke since 1987  Review of Systems  As per HPI.     Vitals Vital Signs [Data Includes: Last 1 Day]  21Dec2012 03:19PM  Blood Pressure: 120 / 75 Heart Rate: 71 Respiration: 18  Physical Exam Constitutional: Well nourished and well developed . No acute distress.  ENT:. The ears and nose are normal in appearance.  Neck: The appearance of the neck is normal and no neck mass is present.  Pulmonary: No respiratory distress and normal respiratory rhythm and effort.  Cardiovascular: Heart rate and rhythm are normal . No peripheral edema.  Abdomen: The  abdomen is soft and nontender. No masses are palpated. No CVA tenderness. No hernias are palpable. No hepatosplenomegaly noted.  Genitourinary: Examination of the penis demonstrates a normal meatus. The penis is circumcised. The right testis is palpably normal. The left testis is normal.  Lymphatics: The femoral and inguinal nodes are not enlarged or tender.  Skin: Normal skin turgor, no visible rash and no visible skin lesions.  Neuro/Psych:. Mood and affect are appropriate.    Results/Data Urine [Data Includes: Last 1 Day]   21Dec2012  COLOR YELLOW   APPEARANCE CLEAR   SPECIFIC GRAVITY 1.020   pH 7.0   GLUCOSE NEG mg/dL  BILIRUBIN NEG   KETONE NEG mg/dL  BLOOD TRACE   PROTEIN NEG mg/dL  UROBILINOGEN 0.2 mg/dL  NITRITE NEG   LEUKOCYTE ESTERASE TRACE   SQUAMOUS EPITHELIAL/HPF RARE   WBC 4-6 WBC/hpf  RBC 0-3 RBC/hpf  BACTERIA NONE SEEN   CRYSTALS NONE SEEN   CASTS NONE SEEN    Procedure  Procedure:  Indication: Lower Urinary Tract Symptoms.  Informed Consent: Risks, benefits, and potential adverse events were discussed and informed consent was obtained from the patient . Specific risks including, but not limited to bleeding, infection, pain, allergic reaction etc. were explained.  Prep: The patient was prepped with betadine.  Anesthesia:. Local anesthesia was administered intraurethrally with 2% lidocaine jelly.  Antibiotic prophylaxis: Ciprofloxacin.  Procedure Note:  Urethral meatus:. No abnormalities.  Anterior urethra: No abnormalities.  Bladder:  He had TURP and the bladder neck is open. Visulization was clear. A single  a stone was present. The patient tolerated the procedure well.  Complications: None.    Assessment Assessed  1. Bladder Calculus 594.1  Plan Health Maintenance (V70.0)  1. UA With REFLEX  Done: 21Dec2012 02:47PM Nephrolithiasis (592.0)  2. Follow-up Schedule Surgery Office  Follow-up  Done: 21Dec2012   He needs cystoscopy, holmium laser of the  bladder stone.   Signatures Electronically signed by : Su Grand, M.D.; Nov 27 2011  3:56PM

## 2011-12-18 ENCOUNTER — Ambulatory Visit (HOSPITAL_BASED_OUTPATIENT_CLINIC_OR_DEPARTMENT_OTHER)
Admission: RE | Admit: 2011-12-18 | Discharge: 2011-12-18 | Disposition: A | Discharge status: Home / Self Care | Payer: BC Managed Care – PPO | Source: Ambulatory Visit | Attending: Urology | Admitting: Urology

## 2011-12-18 ENCOUNTER — Encounter (HOSPITAL_BASED_OUTPATIENT_CLINIC_OR_DEPARTMENT_OTHER): Payer: Self-pay | Admitting: Anesthesiology

## 2011-12-18 ENCOUNTER — Other Ambulatory Visit: Payer: Self-pay

## 2011-12-18 ENCOUNTER — Encounter (HOSPITAL_BASED_OUTPATIENT_CLINIC_OR_DEPARTMENT_OTHER)
Admission: RE | Disposition: A | Discharge status: Home / Self Care | Payer: Self-pay | Source: Ambulatory Visit | Attending: Urology

## 2011-12-18 ENCOUNTER — Encounter (HOSPITAL_BASED_OUTPATIENT_CLINIC_OR_DEPARTMENT_OTHER): Payer: Self-pay | Admitting: *Deleted

## 2011-12-18 ENCOUNTER — Ambulatory Visit (HOSPITAL_BASED_OUTPATIENT_CLINIC_OR_DEPARTMENT_OTHER): Payer: BC Managed Care – PPO | Admitting: Anesthesiology

## 2011-12-18 DIAGNOSIS — Z8546 Personal history of malignant neoplasm of prostate: Secondary | ICD-10-CM | POA: Insufficient documentation

## 2011-12-18 DIAGNOSIS — Z79899 Other long term (current) drug therapy: Secondary | ICD-10-CM | POA: Insufficient documentation

## 2011-12-18 DIAGNOSIS — I10 Essential (primary) hypertension: Secondary | ICD-10-CM | POA: Insufficient documentation

## 2011-12-18 DIAGNOSIS — Z01810 Encounter for preprocedural cardiovascular examination: Secondary | ICD-10-CM | POA: Insufficient documentation

## 2011-12-18 DIAGNOSIS — N210 Calculus in bladder: Secondary | ICD-10-CM | POA: Insufficient documentation

## 2011-12-18 DIAGNOSIS — Z01812 Encounter for preprocedural laboratory examination: Secondary | ICD-10-CM | POA: Insufficient documentation

## 2011-12-18 HISTORY — DX: Urgency of urination: R39.15

## 2011-12-18 HISTORY — DX: Personal history of urinary calculi: Z87.442

## 2011-12-18 HISTORY — DX: Dysuria: R30.0

## 2011-12-18 HISTORY — DX: Personal history of other diseases of the digestive system: Z87.19

## 2011-12-18 HISTORY — PX: CYSTOSCOPY: SHX5120

## 2011-12-18 HISTORY — DX: Essential (primary) hypertension: I10

## 2011-12-18 LAB — POCT I-STAT 4, (NA,K, GLUC, HGB,HCT)
HCT: 45 % (ref 39.0–52.0)
Sodium: 138 mEq/L (ref 135–145)

## 2011-12-18 SURGERY — CYSTOSCOPY
Anesthesia: General | Site: Bladder | Wound class: Clean Contaminated

## 2011-12-18 MED ORDER — SODIUM CHLORIDE 0.9 % IR SOLN
Status: DC | PRN
  Administered 2011-12-18: 6000 mL

## 2011-12-18 MED ORDER — CIPROFLOXACIN IN D5W 400 MG/200ML IV SOLN
400.0000 mg | Freq: Once | INTRAVENOUS | Status: AC
  Administered 2011-12-18: 400 mg via INTRAVENOUS

## 2011-12-18 MED ORDER — PROMETHAZINE HCL 25 MG/ML IJ SOLN: 6.2500 mg | INTRAMUSCULAR | Status: DC | PRN

## 2011-12-18 MED ORDER — PROPOFOL 10 MG/ML IV EMUL
INTRAVENOUS | Status: DC | PRN
  Administered 2011-12-18: 300 mg via INTRAVENOUS

## 2011-12-18 MED ORDER — DEXAMETHASONE SODIUM PHOSPHATE 4 MG/ML IJ SOLN
INTRAMUSCULAR | Status: DC | PRN
  Administered 2011-12-18: 8 mg via INTRAVENOUS

## 2011-12-18 MED ORDER — FENTANYL CITRATE 0.05 MG/ML IJ SOLN
INTRAMUSCULAR | Status: DC | PRN
  Administered 2011-12-18 (×2): 25 ug via INTRAVENOUS
  Administered 2011-12-18: 50 ug via INTRAVENOUS

## 2011-12-18 MED ORDER — LIDOCAINE HCL (CARDIAC) 20 MG/ML IV SOLN
INTRAVENOUS | Status: DC | PRN
  Administered 2011-12-18: 100 mg via INTRAVENOUS

## 2011-12-18 MED ORDER — CEFAZOLIN SODIUM 1-5 GM-% IV SOLN: 1.0000 g | INTRAVENOUS | Status: DC

## 2011-12-18 MED ORDER — KETOROLAC TROMETHAMINE 30 MG/ML IJ SOLN: 15.0000 mg | Freq: Once | INTRAMUSCULAR | Status: DC | PRN

## 2011-12-18 MED ORDER — KETOROLAC TROMETHAMINE 30 MG/ML IJ SOLN
INTRAMUSCULAR | Status: DC | PRN
  Administered 2011-12-18: 30 mg via INTRAVENOUS

## 2011-12-18 MED ORDER — MIDAZOLAM HCL 5 MG/5ML IJ SOLN
INTRAMUSCULAR | Status: DC | PRN
  Administered 2011-12-18: 2 mg via INTRAVENOUS

## 2011-12-18 MED ORDER — ONDANSETRON HCL 4 MG/2ML IJ SOLN
INTRAMUSCULAR | Status: DC | PRN
  Administered 2011-12-18: 4 mg via INTRAVENOUS

## 2011-12-18 MED ORDER — FENTANYL CITRATE 0.05 MG/ML IJ SOLN: 25.0000 ug | INTRAMUSCULAR | Status: DC | PRN

## 2011-12-18 MED ORDER — LACTATED RINGERS IV SOLN
INTRAVENOUS | Status: DC
  Administered 2011-12-18: 10:00:00 via INTRAVENOUS
  Administered 2011-12-18: 100 mL/h via INTRAVENOUS

## 2011-12-18 SURGICAL SUPPLY — 43 items
ADAPTER CATH URET PLST 4-6FR (CATHETERS) IMPLANT
BAG DRAIN URO-CYSTO SKYTR STRL (DRAIN) ×2 IMPLANT
BASKET LASER NITINOL 1.9FR (BASKET) IMPLANT
BASKET SEGURA 3FR (UROLOGICAL SUPPLIES) IMPLANT
BASKET STNLS GEMINI 4WIRE 3FR (BASKET) IMPLANT
BASKET ZERO TIP NITINOL 2.4FR (BASKET) ×2 IMPLANT
BRUSH URET BIOPSY 3F (UROLOGICAL SUPPLIES) IMPLANT
CANISTER SUCT LVC 12 LTR MEDI- (MISCELLANEOUS) IMPLANT
CATH INTERMIT  6FR 70CM (CATHETERS) IMPLANT
CATH URET 5FR 28IN CONE TIP (BALLOONS)
CATH URET 5FR 28IN OPEN ENDED (CATHETERS) IMPLANT
CATH URET 5FR 70CM CONE TIP (BALLOONS) IMPLANT
CLOTH BEACON ORANGE TIMEOUT ST (SAFETY) ×2 IMPLANT
DRAPE CAMERA CLOSED 9X96 (DRAPES) ×2 IMPLANT
ELECT REM PT RETURN 9FT ADLT (ELECTROSURGICAL) ×2
ELECTRODE REM PT RTRN 9FT ADLT (ELECTROSURGICAL) ×1 IMPLANT
GLOVE BIO SURGEON STRL SZ 6.5 (GLOVE) ×4 IMPLANT
GLOVE BIO SURGEON STRL SZ7 (GLOVE) ×2 IMPLANT
GLOVE BIOGEL PI IND STRL 7.0 (GLOVE) ×1 IMPLANT
GLOVE BIOGEL PI INDICATOR 7.0 (GLOVE) ×1
GLOVE ECLIPSE 6.0 STRL STRAW (GLOVE) ×2 IMPLANT
GLOVE INDICATOR 7.5 STRL GRN (GLOVE) ×2 IMPLANT
GOWN BRE IMP SLV AUR LG STRL (GOWN DISPOSABLE) ×4 IMPLANT
GOWN XL W/COTTON TOWEL STD (GOWNS) ×2 IMPLANT
GUIDEWIRE 0.038 PTFE COATED (WIRE) IMPLANT
GUIDEWIRE ANG ZIPWIRE 038X150 (WIRE) IMPLANT
GUIDEWIRE STR DUAL SENSOR (WIRE) IMPLANT
IV NS IRRIG 3000ML ARTHROMATIC (IV SOLUTION) ×4 IMPLANT
KIT BALLIN UROMAX 15FX10 (LABEL) IMPLANT
KIT BALLN UROMAX 15FX4 (MISCELLANEOUS) IMPLANT
KIT BALLN UROMAX 26 75X4 (MISCELLANEOUS)
LASER FIBER DISP (UROLOGICAL SUPPLIES) ×2 IMPLANT
LASER FIBER DISP 1000U (UROLOGICAL SUPPLIES) IMPLANT
NDL SAFETY ECLIPSE 18X1.5 (NEEDLE) IMPLANT
NEEDLE HYPO 18GX1.5 SHARP (NEEDLE)
NEEDLE HYPO 22GX1.5 SAFETY (NEEDLE) IMPLANT
NS IRRIG 500ML POUR BTL (IV SOLUTION) IMPLANT
PACK CYSTOSCOPY (CUSTOM PROCEDURE TRAY) ×2 IMPLANT
SET HIGH PRES BAL DIL (LABEL)
SHEATH URET ACCESS 12FR/35CM (UROLOGICAL SUPPLIES) IMPLANT
SHEATH URET ACCESS 12FR/55CM (UROLOGICAL SUPPLIES) IMPLANT
SYR 20CC LL (SYRINGE) IMPLANT
WATER STERILE IRR 3000ML UROMA (IV SOLUTION) ×2 IMPLANT

## 2011-12-18 NOTE — Anesthesia Preprocedure Evaluation (Signed)
Anesthesia Evaluation  Patient identified by MRN, date of birth, ID band Patient awake    Reviewed: Allergy & Precautions, H&P , NPO status , Patient's Chart, lab work & pertinent test results  Airway Mallampati: II TM Distance: <3 FB Neck ROM: Full    Dental No notable dental hx.    Pulmonary neg pulmonary ROS, former smoker 70 pk yr hx clear to auscultation  Pulmonary exam normal       Cardiovascular hypertension, Regular Normal    Neuro/Psych Negative Neurological ROS  Negative Psych ROS   GI/Hepatic negative GI ROS, Neg liver ROS,   Endo/Other  Morbid obesity  Renal/GU negative Renal ROS  Genitourinary negative   Musculoskeletal negative musculoskeletal ROS (+)   Abdominal   Peds negative pediatric ROS (+)  Hematology negative hematology ROS (+)   Anesthesia Other Findings   Reproductive/Obstetrics negative OB ROS                           Anesthesia Physical Anesthesia Plan  ASA: III  Anesthesia Plan: General   Post-op Pain Management:    Induction: Intravenous  Airway Management Planned: LMA  Additional Equipment:   Intra-op Plan:   Post-operative Plan:   Informed Consent: I have reviewed the patients History and Physical, chart, labs and discussed the procedure including the risks, benefits and alternatives for the proposed anesthesia with the patient or authorized representative who has indicated his/her understanding and acceptance.   Dental advisory given  Plan Discussed with: CRNA  Anesthesia Plan Comments:         Anesthesia Quick Evaluation

## 2011-12-18 NOTE — Op Note (Signed)
Peter Jackson is a 66 y.o.   12/18/2011  Anesthesia: General  Preop diagnosis: bladder calculus  Postop diagnosis: same  Procedure done: Cystoscopy, holmium laser bladder calculus and stone extraction  Surgeon: Wendie Simmer. Tyree Vandruff  Indication: Peter Jackson is a 66 years old male who has a long history of kidney stones. For the past 3-4 weeks he has been having difficulty voiding. He states that he thinks that he has a kidney stone that has dropped in the bladder. Cystoscopy showed a bladder calculus that is too large for him to pass spontaneously. He is scheduled today for holmium laser of the bladder stone and stone extraction.  Procedure: The patient was identified by his wrist band and proper timeout was taken.  Under general anesthesia he was prepped and draped and placed in the dorsolithotomy position. A panendoscope was inserted in the bladder. The anterior urethra is normal; he had a previous TURP and the bladder neck is open. There is a 2 cm stone in the bladder. The bladder mucosa is normal. There is no tumor in the bladder. The ureteral orifices are in normal position and shape. With a 365 microfiber and in the holmium laser at a setting of 0.5 joule the stone was fragmented in multiple  fragments. The fragments were then removed with the Nitinol basket. All fragments were removed the inner with a Nitinol basket or irrigation. There is no evidence of remaining fragments in the bladder. The bladder was then irrigated with normal saline. There was no bleeding at the end of the procedure. The bladder was then emptied and the cystoscope removed.  The patient tolerated the procedure well and left the OR in satisfactory condition to postanesthesia care unit.

## 2011-12-18 NOTE — Anesthesia Procedure Notes (Signed)
Procedure Name: LMA Insertion Date/Time: 12/18/2011 9:14 AM Performed by: Huel Coventry Pre-anesthesia Checklist: Patient identified, Emergency Drugs available, Suction available and Patient being monitored Patient Re-evaluated:Patient Re-evaluated prior to inductionOxygen Delivery Method: Circle System Utilized Preoxygenation: Pre-oxygenation with 100% oxygen Intubation Type: IV induction Ventilation: Mask ventilation without difficulty LMA: LMA with gastric port inserted LMA Size: 5.0 Number of attempts: 1 Placement Confirmation: positive ETCO2 Tube secured with: Tape Dental Injury: Teeth and Oropharynx as per pre-operative assessment

## 2011-12-18 NOTE — Transfer of Care (Signed)
Immediate Anesthesia Transfer of Care Note  Patient: Peter Jackson  Procedure(s) Performed:  CYSTOSCOPY - HOLMIUM LASER OF BLADDER STONE; HOLMIUM LASER APPLICATION  Patient Location: PACU  Anesthesia Type: General  Level of Consciousness: awake, alert  and oriented  Airway & Oxygen Therapy: Patient Spontanous Breathing and Patient connected to face mask oxygen  Post-op Assessment: Report given to PACU RN and Post -op Vital signs reviewed and stable  Post vital signs: Reviewed and stable  Complications: No apparent anesthesia complications   Post vital signs: Filed Vitals:   12/18/11 1005  BP:   Pulse: 63  Temp:   Resp: 11

## 2011-12-18 NOTE — Anesthesia Postprocedure Evaluation (Signed)
  Anesthesia Post-op Note  Patient: Peter Jackson  Procedure(s) Performed:  CYSTOSCOPY - HOLMIUM LASER OF BLADDER STONE; HOLMIUM LASER APPLICATION  Patient Location: PACU  Anesthesia Type: General  Level of Consciousness: awake and alert   Airway and Oxygen Therapy: Patient Spontanous Breathing  Post-op Pain: mild  Post-op Assessment: Post-op Vital signs reviewed, Patient's Cardiovascular Status Stable, Respiratory Function Stable, Patent Airway and No signs of Nausea or vomiting  Post-op Vital Signs: stable  Complications: No apparent anesthesia complications

## 2011-12-21 ENCOUNTER — Encounter (HOSPITAL_BASED_OUTPATIENT_CLINIC_OR_DEPARTMENT_OTHER): Payer: Self-pay | Admitting: Urology

## 2014-10-11 ENCOUNTER — Other Ambulatory Visit: Payer: Self-pay | Admitting: Urology

## 2014-11-05 NOTE — Patient Instructions (Addendum)
Peter MuckleWilliam A Jackson  11/05/2014   Your procedure is scheduled on:  11/16/2014    Come thru the [facility] Entrance.    Follow the Signs to Short Stay Center at  0700      am  Call this number if you have problems the morning of surgery: [phone]   Remember:   Do not eat food or drink liquids after midnight.   Take these medicines the morning of surgery with A SIP OF WATER: none    Do not wear jewelry,   Do not wear lotions, powders, or perfumes.  deodorant.  . Men may shave face and neck.  Do not bring valuables to the hospital.  Contacts, dentures or bridgework may not be worn into surgery.  Leave suitcase in the car. After surgery it may be brought to your room.  For patients admitted to the hospital, checkout time is 11:00 AM the day of  discharge.         Please read over the following fact sheets that you were given:  coughing and deep breathing exercises, leg exercises            [facility] - Preparing for Surgery Before surgery, you can play an important role.  Because skin is not sterile, your skin needs to be as free of germs as possible.  You can reduce the number of germs on your skin by washing with CHG (chlorahexidine gluconate) soap before surgery.  CHG is an antiseptic cleaner which kills germs and bonds with the skin to continue killing germs even after washing. Please DO NOT use if you have an allergy to CHG or antibacterial soaps.  If your skin becomes reddened/irritated stop using the CHG and inform your nurse when you arrive at Short Stay. Do not shave (including legs and underarms) for at least 48 hours prior to the first CHG shower.  You may shave your face/neck. Please follow these instructions carefully:  1.  Shower with CHG Soap the night before surgery and the  morning of Surgery.  2.  If you choose to wash your hair, wash your hair first as usual with your  normal  shampoo.  3.  After you shampoo, rinse your hair and body thoroughly to remove the   shampoo.                           4.  Use CHG as you would any other liquid soap.  You can apply chg directly  to the skin and wash                       Gently with a scrungie or clean washcloth.  5.  Apply the CHG Soap to your body ONLY FROM THE NECK DOWN.   Do not use on face/ open                           Wound or open sores. Avoid contact with eyes, ears mouth and genitals (private parts).                       Wash face,  Genitals (private parts) with your normal soap.             6.  Wash thoroughly, paying special attention to the area where your surgery  will be performed.  7.  Thoroughly rinse your body  with warm water from the neck down.  8.  DO NOT shower/wash with your normal soap after using and rinsing off  the CHG Soap.                9.  Pat yourself dry with a clean towel.            10.  Wear clean pajamas.            11.  Place clean sheets on your bed the night of your first shower and do not  sleep with pets. Day of Surgery : Do not apply any lotions/deodorants the morning of surgery.  Please wear clean clothes to the hospital/surgery center.  FAILURE TO FOLLOW THESE INSTRUCTIONS MAY RESULT IN THE CANCELLATION OF YOUR SURGERY PATIENT SIGNATURE_________________________________  NURSE SIGNATURE__________________________________  ________________________________________________________________________  WHAT IS A BLOOD TRANSFUSION? Blood Transfusion Information  A transfusion is the replacement of blood or some of its parts. Blood is made up of multiple cells which provide different functions.  Red blood cells carry oxygen and are used for blood loss replacement.  White blood cells fight against infection.  Platelets control bleeding.  Plasma helps clot blood.  Other blood products are available for specialized needs, such as hemophilia or other clotting disorders. BEFORE THE TRANSFUSION  Who gives blood for transfusions?   Healthy volunteers who are fully  evaluated to make sure their blood is safe. This is blood bank blood. Transfusion therapy is the safest it has ever been in the practice of medicine. Before blood is taken from a donor, a complete history is taken to make sure that person has no history of diseases nor engages in risky social behavior (examples are intravenous drug use or sexual activity with multiple partners). The donor's travel history is screened to minimize risk of transmitting infections, such as malaria. The donated blood is tested for signs of infectious diseases, such as HIV and hepatitis. The blood is then tested to be sure it is compatible with you in order to minimize the chance of a transfusion reaction. If you or a relative donates blood, this is often done in anticipation of surgery and is not appropriate for emergency situations. It takes many days to process the donated blood. RISKS AND COMPLICATIONS Although transfusion therapy is very safe and saves many lives, the main dangers of transfusion include:   Getting an infectious disease.  Developing a transfusion reaction. This is an allergic reaction to something in the blood you were given. Every precaution is taken to prevent this. The decision to have a blood transfusion has been considered carefully by your caregiver before blood is given. Blood is not given unless the benefits outweigh the risks. AFTER THE TRANSFUSION  Right after receiving a blood transfusion, you will usually feel much better and more energetic. This is especially true if your red blood cells have gotten low (anemic). The transfusion raises the level of the red blood cells which carry oxygen, and this usually causes an energy increase.  The nurse administering the transfusion will monitor you carefully for complications. HOME CARE INSTRUCTIONS  No special instructions are needed after a transfusion. You may find your energy is better. Speak with your caregiver about any limitations on activity  for underlying diseases you may have. SEEK MEDICAL CARE IF:   Your condition is not improving after your transfusion.  You develop redness or irritation at the intravenous (IV) site. SEEK IMMEDIATE MEDICAL CARE IF:  Any of the following symptoms occur  over the next 12 hours:  Shaking chills.  You have a temperature by mouth above 102 F (38.9 C), not controlled by medicine.  Chest, back, or muscle pain.  People around you feel you are not acting correctly or are confused.  Shortness of breath or difficulty breathing.  Dizziness and fainting.  You get a rash or develop hives.  You have a decrease in urine output.  Your urine turns a dark color or changes to pink, red, or brown. Any of the following symptoms occur over the next 10 days:  You have a temperature by mouth above 102 F (38.9 C), not controlled by medicine.  Shortness of breath.  Weakness after normal activity.  The white part of the eye turns yellow (jaundice).  You have a decrease in the amount of urine or are urinating less often.  Your urine turns a dark color or changes to pink, red, or brown. Document Released: 11/20/2000 Document Revised: 02/15/2012 Document Reviewed: 07/09/2008 [facility] Patient Information 2014 [place], [place].  _______________________________________________________________________

## 2014-11-06 ENCOUNTER — Encounter (HOSPITAL_COMMUNITY): Payer: Self-pay

## 2014-11-06 ENCOUNTER — Encounter (HOSPITAL_COMMUNITY)
Admission: RE | Admit: 2014-11-06 | Discharge: 2014-11-06 | Disposition: A | Discharge status: Home / Self Care | Payer: BC Managed Care – PPO | Source: Ambulatory Visit | Attending: Urology | Admitting: Urology

## 2014-11-06 DIAGNOSIS — Z01812 Encounter for preprocedural laboratory examination: Secondary | ICD-10-CM | POA: Diagnosis not present

## 2014-11-06 NOTE — Progress Notes (Signed)
EKG- 10/30/2014  - chart  CXR- 10/30/14- chart  CMP, CBC. DIFF - 10/30/14 chart  Clearance - Dr Mariane Baumgartenagonesi on chart

## 2014-11-07 LAB — URINE CULTURE

## 2014-11-08 NOTE — Progress Notes (Signed)
11-08-14 0915 Urine culture result viewable in Epic. Please note.

## 2014-11-16 ENCOUNTER — Observation Stay (HOSPITAL_COMMUNITY)
Admission: RE | Admit: 2014-11-16 | Discharge: 2014-11-17 | Disposition: A | Discharge status: Home / Self Care | Payer: BC Managed Care – PPO | Source: Ambulatory Visit | Attending: Urology | Admitting: Urology

## 2014-11-16 ENCOUNTER — Inpatient Hospital Stay (HOSPITAL_COMMUNITY): Payer: BC Managed Care – PPO | Admitting: *Deleted

## 2014-11-16 ENCOUNTER — Inpatient Hospital Stay (HOSPITAL_COMMUNITY): Payer: BC Managed Care – PPO

## 2014-11-16 ENCOUNTER — Encounter (HOSPITAL_COMMUNITY)
Admission: RE | Disposition: A | Discharge status: Home / Self Care | Payer: Self-pay | Source: Ambulatory Visit | Attending: Urology

## 2014-11-16 ENCOUNTER — Encounter (HOSPITAL_COMMUNITY): Payer: Self-pay | Admitting: *Deleted

## 2014-11-16 DIAGNOSIS — I10 Essential (primary) hypertension: Secondary | ICD-10-CM | POA: Insufficient documentation

## 2014-11-16 DIAGNOSIS — K5790 Diverticulosis of intestine, part unspecified, without perforation or abscess without bleeding: Secondary | ICD-10-CM | POA: Diagnosis not present

## 2014-11-16 DIAGNOSIS — N200 Calculus of kidney: Principal | ICD-10-CM

## 2014-11-16 DIAGNOSIS — Z6841 Body Mass Index (BMI) 40.0 and over, adult: Secondary | ICD-10-CM | POA: Diagnosis not present

## 2014-11-16 DIAGNOSIS — Z8546 Personal history of malignant neoplasm of prostate: Secondary | ICD-10-CM | POA: Diagnosis not present

## 2014-11-16 DIAGNOSIS — Z881 Allergy status to other antibiotic agents status: Secondary | ICD-10-CM | POA: Insufficient documentation

## 2014-11-16 DIAGNOSIS — I714 Abdominal aortic aneurysm, without rupture: Secondary | ICD-10-CM | POA: Diagnosis not present

## 2014-11-16 DIAGNOSIS — Z87891 Personal history of nicotine dependence: Secondary | ICD-10-CM | POA: Insufficient documentation

## 2014-11-16 DIAGNOSIS — Z87442 Personal history of urinary calculi: Secondary | ICD-10-CM | POA: Diagnosis not present

## 2014-11-16 HISTORY — PX: NEPHROLITHOTOMY: SHX5134

## 2014-11-16 LAB — ABO/RH: ABO/RH(D): A POS

## 2014-11-16 LAB — TYPE AND SCREEN
ABO/RH(D): A POS
Antibody Screen: NEGATIVE

## 2014-11-16 SURGERY — NEPHROLITHOTOMY PERCUTANEOUS
Anesthesia: General | Laterality: Right

## 2014-11-16 MED ORDER — PROPOFOL 10 MG/ML IV BOLUS
INTRAVENOUS | Status: DC | PRN
  Administered 2014-11-16: 300 mg via INTRAVENOUS

## 2014-11-16 MED ORDER — HYDROMORPHONE HCL 1 MG/ML IJ SOLN
0.2500 mg | INTRAMUSCULAR | Status: DC | PRN
  Administered 2014-11-16 (×2): 0.5 mg via INTRAVENOUS

## 2014-11-16 MED ORDER — PROMETHAZINE HCL 25 MG/ML IJ SOLN: 6.2500 mg | INTRAMUSCULAR | Status: DC | PRN

## 2014-11-16 MED ORDER — LIDOCAINE HCL 1 % IJ SOLN
INTRAMUSCULAR | Status: AC
Start: 2014-11-16 — End: 2014-11-16
  Filled 2014-11-16: qty 20

## 2014-11-16 MED ORDER — PHENYLEPHRINE 40 MCG/ML (10ML) SYRINGE FOR IV PUSH (FOR BLOOD PRESSURE SUPPORT)
PREFILLED_SYRINGE | INTRAVENOUS | Status: AC
  Filled 2014-11-16: qty 10

## 2014-11-16 MED ORDER — CLINDAMYCIN PHOSPHATE 900 MG/50ML IV SOLN
900.0000 mg | Freq: Once | INTRAVENOUS | Status: AC
  Administered 2014-11-16: 900 mg via INTRAVENOUS

## 2014-11-16 MED ORDER — ONDANSETRON HCL 4 MG/2ML IJ SOLN
INTRAMUSCULAR | Status: AC
  Filled 2014-11-16: qty 2

## 2014-11-16 MED ORDER — EPHEDRINE SULFATE 50 MG/ML IJ SOLN
INTRAMUSCULAR | Status: DC | PRN
  Administered 2014-11-16 (×2): 5 mg via INTRAVENOUS
  Administered 2014-11-16: 10 mg via INTRAVENOUS
  Administered 2014-11-16: 15 mg via INTRAVENOUS
  Administered 2014-11-16: 5 mg via INTRAVENOUS

## 2014-11-16 MED ORDER — PHENYLEPHRINE HCL 10 MG/ML IJ SOLN
INTRAMUSCULAR | Status: AC
  Filled 2014-11-16: qty 1

## 2014-11-16 MED ORDER — OXYCODONE-ACETAMINOPHEN 5-325 MG PO TABS
1.0000 | ORAL_TABLET | ORAL | Status: DC | PRN
  Administered 2014-11-16 – 2014-11-17 (×3): 2 via ORAL
  Filled 2014-11-16 (×3): qty 2

## 2014-11-16 MED ORDER — PROPOFOL 10 MG/ML IV BOLUS
INTRAVENOUS | Status: AC
  Filled 2014-11-16: qty 20

## 2014-11-16 MED ORDER — CLINDAMYCIN PHOSPHATE 900 MG/50ML IV SOLN
INTRAVENOUS | Status: AC
  Filled 2014-11-16: qty 50

## 2014-11-16 MED ORDER — MORPHINE SULFATE 2 MG/ML IJ SOLN
2.0000 mg | INTRAMUSCULAR | Status: DC | PRN
  Administered 2014-11-16: 2 mg via INTRAVENOUS
  Filled 2014-11-16: qty 1

## 2014-11-16 MED ORDER — LACTATED RINGERS IV SOLN
INTRAVENOUS | Status: DC
  Administered 2014-11-16: 13:00:00 via INTRAVENOUS
  Administered 2014-11-16: 1000 mL via INTRAVENOUS

## 2014-11-16 MED ORDER — DEXAMETHASONE SODIUM PHOSPHATE 10 MG/ML IJ SOLN
INTRAMUSCULAR | Status: AC
  Filled 2014-11-16: qty 1

## 2014-11-16 MED ORDER — SODIUM CHLORIDE 0.9 % IR SOLN
Status: DC | PRN
  Administered 2014-11-16: 30000 mL

## 2014-11-16 MED ORDER — BUPIVACAINE-EPINEPHRINE (PF) 0.25% -1:200000 IJ SOLN
INTRAMUSCULAR | Status: AC
  Filled 2014-11-16: qty 30

## 2014-11-16 MED ORDER — CIPROFLOXACIN IN D5W 400 MG/200ML IV SOLN
400.0000 mg | Freq: Two times a day (BID) | INTRAVENOUS | Status: AC
  Administered 2014-11-16 – 2014-11-17 (×2): 400 mg via INTRAVENOUS
  Filled 2014-11-16 (×2): qty 200

## 2014-11-16 MED ORDER — ONDANSETRON HCL 4 MG/2ML IJ SOLN
4.0000 mg | INTRAMUSCULAR | Status: DC | PRN
  Administered 2014-11-16: 4 mg via INTRAVENOUS
  Filled 2014-11-16: qty 2

## 2014-11-16 MED ORDER — CIPROFLOXACIN IN D5W 400 MG/200ML IV SOLN
INTRAVENOUS | Status: AC
Start: 2014-11-16 — End: 2014-11-16
  Filled 2014-11-16: qty 200

## 2014-11-16 MED ORDER — LIDOCAINE HCL (CARDIAC) 20 MG/ML IV SOLN
INTRAVENOUS | Status: AC
  Filled 2014-11-16: qty 5

## 2014-11-16 MED ORDER — ONDANSETRON HCL 4 MG/2ML IJ SOLN
INTRAMUSCULAR | Status: DC | PRN
  Administered 2014-11-16: 4 mg via INTRAVENOUS

## 2014-11-16 MED ORDER — NEOSTIGMINE METHYLSULFATE 10 MG/10ML IV SOLN
INTRAVENOUS | Status: AC
  Filled 2014-11-16: qty 1

## 2014-11-16 MED ORDER — PHENYLEPHRINE HCL 10 MG/ML IJ SOLN
10.0000 mg | INTRAVENOUS | Status: DC | PRN
  Administered 2014-11-16: 40 ug/min via INTRAVENOUS

## 2014-11-16 MED ORDER — INDAPAMIDE 2.5 MG PO TABS
2.5000 mg | ORAL_TABLET | Freq: Every day | ORAL | Status: DC
  Administered 2014-11-17: 2.5 mg via ORAL
  Filled 2014-11-16: qty 1

## 2014-11-16 MED ORDER — SODIUM CHLORIDE 0.9 % IJ SOLN: 3.0000 mL | Freq: Two times a day (BID) | INTRAMUSCULAR | Status: DC

## 2014-11-16 MED ORDER — BUPIVACAINE-EPINEPHRINE (PF) 0.25% -1:200000 IJ SOLN
INTRAMUSCULAR | Status: DC | PRN
  Administered 2014-11-16: 20 mL

## 2014-11-16 MED ORDER — METOCLOPRAMIDE HCL 5 MG/ML IJ SOLN
INTRAMUSCULAR | Status: DC | PRN
  Administered 2014-11-16: 10 mg via INTRAVENOUS

## 2014-11-16 MED ORDER — MIDAZOLAM HCL 5 MG/5ML IJ SOLN
INTRAMUSCULAR | Status: DC | PRN
  Administered 2014-11-16 (×2): 1 mg via INTRAVENOUS

## 2014-11-16 MED ORDER — SODIUM CHLORIDE 0.9 % IJ SOLN: 3.0000 mL | INTRAMUSCULAR | Status: DC | PRN

## 2014-11-16 MED ORDER — SODIUM CHLORIDE 0.9 % IV SOLN
INTRAVENOUS | Status: DC
Start: 2014-11-16 — End: 2014-11-17
  Administered 2014-11-16 – 2014-11-17 (×2): via INTRAVENOUS

## 2014-11-16 MED ORDER — DIPHENHYDRAMINE HCL 50 MG/ML IJ SOLN: 12.5000 mg | Freq: Four times a day (QID) | INTRAMUSCULAR | Status: DC | PRN

## 2014-11-16 MED ORDER — DIPHENHYDRAMINE HCL 12.5 MG/5ML PO ELIX: 12.5000 mg | ORAL_SOLUTION | Freq: Four times a day (QID) | ORAL | Status: DC | PRN

## 2014-11-16 MED ORDER — PHENYLEPHRINE HCL 10 MG/ML IJ SOLN
INTRAMUSCULAR | Status: DC | PRN
  Administered 2014-11-16 (×2): 80 ug via INTRAVENOUS
  Administered 2014-11-16: 120 ug via INTRAVENOUS
  Administered 2014-11-16: 40 ug via INTRAVENOUS

## 2014-11-16 MED ORDER — CIPROFLOXACIN IN D5W 400 MG/200ML IV SOLN
400.0000 mg | INTRAVENOUS | Status: AC
  Administered 2014-11-16: 400 mg via INTRAVENOUS

## 2014-11-16 MED ORDER — NEOSTIGMINE METHYLSULFATE 10 MG/10ML IV SOLN
INTRAVENOUS | Status: DC | PRN
  Administered 2014-11-16: 5 mg via INTRAVENOUS

## 2014-11-16 MED ORDER — ROCURONIUM BROMIDE 100 MG/10ML IV SOLN
INTRAVENOUS | Status: AC
  Filled 2014-11-16: qty 1

## 2014-11-16 MED ORDER — FENTANYL CITRATE 0.05 MG/ML IJ SOLN
INTRAMUSCULAR | Status: AC
  Filled 2014-11-16: qty 5

## 2014-11-16 MED ORDER — CYCLOBENZAPRINE HCL 10 MG PO TABS
10.0000 mg | ORAL_TABLET | Freq: Every day | ORAL | Status: DC
  Administered 2014-11-17: 10 mg via ORAL
  Filled 2014-11-16: qty 1

## 2014-11-16 MED ORDER — SUCCINYLCHOLINE CHLORIDE 20 MG/ML IJ SOLN
INTRAMUSCULAR | Status: DC | PRN
  Administered 2014-11-16: 160 mg via INTRAVENOUS

## 2014-11-16 MED ORDER — FENTANYL CITRATE 0.05 MG/ML IJ SOLN
INTRAMUSCULAR | Status: DC | PRN
  Administered 2014-11-16 (×3): 50 ug via INTRAVENOUS

## 2014-11-16 MED ORDER — HYDROMORPHONE HCL 1 MG/ML IJ SOLN
INTRAMUSCULAR | Status: AC
  Filled 2014-11-16: qty 1

## 2014-11-16 MED ORDER — GLYCOPYRROLATE 0.2 MG/ML IJ SOLN
INTRAMUSCULAR | Status: DC | PRN
  Administered 2014-11-16: 0.6 mg via INTRAVENOUS

## 2014-11-16 MED ORDER — SODIUM CHLORIDE 0.9 % IV SOLN: 250.0000 mL | INTRAVENOUS | Status: DC | PRN

## 2014-11-16 MED ORDER — GLYCOPYRROLATE 0.2 MG/ML IJ SOLN
INTRAMUSCULAR | Status: AC
  Filled 2014-11-16: qty 4

## 2014-11-16 MED ORDER — MIDAZOLAM HCL 2 MG/2ML IJ SOLN
INTRAMUSCULAR | Status: AC
  Filled 2014-11-16: qty 2

## 2014-11-16 MED ORDER — ROCURONIUM BROMIDE 100 MG/10ML IV SOLN
INTRAVENOUS | Status: DC | PRN
  Administered 2014-11-16: 10 mg via INTRAVENOUS
  Administered 2014-11-16: 40 mg via INTRAVENOUS
  Administered 2014-11-16 (×2): 10 mg via INTRAVENOUS

## 2014-11-16 MED ORDER — IOHEXOL 300 MG/ML  SOLN
INTRAMUSCULAR | Status: DC | PRN
  Administered 2014-11-16: 135 mL via URETHRAL

## 2014-11-16 MED ORDER — METOCLOPRAMIDE HCL 5 MG/ML IJ SOLN
INTRAMUSCULAR | Status: AC
  Filled 2014-11-16: qty 2

## 2014-11-16 MED ORDER — LIDOCAINE HCL (PF) 1 % IJ SOLN
INTRAMUSCULAR | Status: DC | PRN
  Administered 2014-11-16: 20 mL

## 2014-11-16 MED ORDER — SENNOSIDES-DOCUSATE SODIUM 8.6-50 MG PO TABS
2.0000 | ORAL_TABLET | Freq: Every day | ORAL | Status: DC
  Administered 2014-11-16: 2 via ORAL
  Filled 2014-11-16: qty 2

## 2014-11-16 MED ORDER — EPHEDRINE SULFATE 50 MG/ML IJ SOLN
INTRAMUSCULAR | Status: AC
  Filled 2014-11-16: qty 1

## 2014-11-16 MED ORDER — LIDOCAINE HCL (CARDIAC) 20 MG/ML IV SOLN
INTRAVENOUS | Status: DC | PRN
  Administered 2014-11-16: 50 mg via INTRAVENOUS

## 2014-11-16 SURGICAL SUPPLY — 53 items
BAG URINE DRAINAGE (UROLOGICAL SUPPLIES) ×2 IMPLANT
BASKET ZERO TIP NITINOL 2.4FR (BASKET) IMPLANT
BENZOIN TINCTURE PRP APPL 2/3 (GAUZE/BANDAGES/DRESSINGS) ×4 IMPLANT
BLADE SURG 15 STRL LF DISP TIS (BLADE) ×1 IMPLANT
BLADE SURG 15 STRL SS (BLADE) ×1
CATH AINSWORTH 30CC 24FR (CATHETERS) IMPLANT
CATH BEACON 5.038 65CM KMP-01 (CATHETERS) ×4 IMPLANT
CATH COUNCIL 22FR (CATHETERS) ×2 IMPLANT
CATH FOLEY LATEX FREE 16FR (CATHETERS) ×2 IMPLANT
CATH URET 5FR 28IN OPEN ENDED (CATHETERS) IMPLANT
CATH X-FORCE N30 NEPHROSTOMY (TUBING) ×2 IMPLANT
CHLORAPREP W/TINT 26ML (MISCELLANEOUS) ×2 IMPLANT
COVER SURGICAL LIGHT HANDLE (MISCELLANEOUS) ×2 IMPLANT
DRAPE C-ARM 42X120 X-RAY (DRAPES) ×2 IMPLANT
DRAPE CAMERA CLOSED 9X96 (DRAPES) ×2 IMPLANT
DRAPE LINGEMAN PERC (DRAPES) ×2 IMPLANT
DRAPE SHEET LG 3/4 BI-LAMINATE (DRAPES) ×2 IMPLANT
DRAPE SURG IRRIG POUCH 19X23 (DRAPES) ×4 IMPLANT
DRSG PAD ABDOMINAL 8X10 ST (GAUZE/BANDAGES/DRESSINGS) ×4 IMPLANT
DRSG TEGADERM 8X12 (GAUZE/BANDAGES/DRESSINGS) ×4 IMPLANT
FIBER LASER FLEXIVA 550 (UROLOGICAL SUPPLIES) IMPLANT
GAUZE SPONGE 4X4 12PLY STRL (GAUZE/BANDAGES/DRESSINGS) ×2 IMPLANT
GLOVE BIO SURGEON STRL SZ8 (GLOVE) ×2 IMPLANT
GLOVE BIOGEL M STRL SZ7.5 (GLOVE) ×4 IMPLANT
GLOVE BIOGEL PI IND STRL 6.5 (GLOVE) ×1 IMPLANT
GLOVE BIOGEL PI IND STRL 8.5 (GLOVE) ×1 IMPLANT
GLOVE BIOGEL PI INDICATOR 6.5 (GLOVE) ×1
GLOVE BIOGEL PI INDICATOR 8.5 (GLOVE) ×1
GOWN STRL REUS W/TWL XL LVL3 (GOWN DISPOSABLE) ×10 IMPLANT
GUIDEWIRE AMPLAZ .035X145 (WIRE) ×4 IMPLANT
GUIDEWIRE ANG ZIPWIRE 038X150 (WIRE) IMPLANT
GUIDEWIRE STR DUAL SENSOR (WIRE) ×2 IMPLANT
IV NS IRRIG 3000ML ARTHROMATIC (IV SOLUTION) ×18 IMPLANT
KIT BASIN OR (CUSTOM PROCEDURE TRAY) ×2 IMPLANT
MANIFOLD NEPTUNE II (INSTRUMENTS) ×2 IMPLANT
NEEDLE TROCAR 18X15 ECHO (NEEDLE) IMPLANT
NEEDLE TROCAR 18X20 (NEEDLE) ×2 IMPLANT
NS IRRIG 1000ML POUR BTL (IV SOLUTION) ×2 IMPLANT
PACK BASIC VI WITH GOWN DISP (CUSTOM PROCEDURE TRAY) ×2 IMPLANT
PACK CYSTO (CUSTOM PROCEDURE TRAY) ×2 IMPLANT
PROBE LITHOCLAST ULTRA 3.8X403 (UROLOGICAL SUPPLIES) ×2 IMPLANT
PROBE PNEUMATIC 1.0MMX570MM (UROLOGICAL SUPPLIES) ×2 IMPLANT
SHEATH PEELAWAY SET 9 (SHEATH) ×2 IMPLANT
SHEATH X FORCE 10MMX22CM (SHEATH) ×2 IMPLANT
SPONGE LAP 4X18 X RAY DECT (DISPOSABLE) ×2 IMPLANT
STENT CONTOUR 6FRX26X.038 (STENTS) ×2 IMPLANT
STONE CATCHER W/TUBE ADAPTER (UROLOGICAL SUPPLIES) ×4 IMPLANT
SUT ETHILON 3 0 PS 1 (SUTURE) ×2 IMPLANT
SUT SILK 0 FSL (SUTURE) ×2 IMPLANT
SYR 20CC LL (SYRINGE) ×4 IMPLANT
SYRINGE 10CC LL (SYRINGE) ×2 IMPLANT
TOWEL OR 17X26 10 PK STRL BLUE (TOWEL DISPOSABLE) ×2 IMPLANT
TUBING CONNECTING 10 (TUBING) ×4 IMPLANT

## 2014-11-16 NOTE — Progress Notes (Signed)
Pharmacy Brief Note:  Order received for pharmacy to adjust antibiotic dosages for renal function as needed.    Currently on Cipro 400mg  IV q12h following R nephrolithotomy and ureteral stent placement for large partial staghorn renal calculus.  No hx of CKD noted. SCr was 0.97 on 10/30/14 per [facility].  This corresponds to an estimated creatinine clearance of >100 mL/min.  Ordered SCr with AM labs to confirm appropriateness of current Cipro dosage.  Elie Goodyandy Tremaine Earwood, PharmD, BCPS Pager: [phone] 11/16/2014  2:16 PM

## 2014-11-16 NOTE — Anesthesia Postprocedure Evaluation (Signed)
  Anesthesia Post-op Note  Patient: Peter MuckleWilliam A Connelly  Procedure(s) Performed: Procedure(s) (LRB): RIGHT NEPHROLITHOTOMY PERCUTANEOUS WITH SURGEON ACCESS, DOUBLE J STENT (Right)  Patient Location: PACU  Anesthesia Type: General  Level of Consciousness: awake and alert   Airway and Oxygen Therapy: Patient Spontanous Breathing  Post-op Pain: mild  Post-op Assessment: Post-op Vital signs reviewed, Patient's Cardiovascular Status Stable, Respiratory Function Stable, Patent Airway and No signs of Nausea or vomiting  Last Vitals:  Filed Vitals:   11/16/14 1351  BP: 108/54  Pulse: 69  Temp: 36.4 C  Resp: 15    Post-op Vital Signs: stable   Complications: No apparent anesthesia complications

## 2014-11-16 NOTE — Anesthesia Preprocedure Evaluation (Addendum)
Anesthesia Evaluation  Patient identified by MRN, date of birth, ID band Patient awake    Reviewed: Allergy & Precautions, H&P , NPO status , Patient's Chart, lab work & pertinent test results  Airway Mallampati: II  TM Distance: >3 FB Neck ROM: Full    Dental  (+) Dental Advisory Given, Caps Full crowns/bridges.:   Pulmonary former smoker,  breath sounds clear to auscultation  Pulmonary exam normal       Cardiovascular Exercise Tolerance: Good hypertension, Pt. on medications and Pt. on home beta blockers Rhythm:Regular Rate:Normal  He did restart his blood pressure medicine 2 weeks ago. 119/78 today.  No cardiopulmonary symptoms.   Neuro/Psych negative neurological ROS  negative psych ROS   GI/Hepatic negative GI ROS, Neg liver ROS,   Endo/Other  negative endocrine ROSMorbid obesity  Renal/GU Renal disease  negative genitourinary   Musculoskeletal negative musculoskeletal ROS (+)   Abdominal (+) + obese,   Peds negative pediatric ROS (+)  Hematology negative hematology ROS (+)   Anesthesia Other Findings   Reproductive/Obstetrics negative OB ROS                            Anesthesia Physical Anesthesia Plan  ASA: III  Anesthesia Plan: General   Post-op Pain Management:    Induction: Intravenous  Airway Management Planned: Oral ETT  Additional Equipment:   Intra-op Plan:   Post-operative Plan: Extubation in OR  Informed Consent: I have reviewed the patients History and Physical, chart, labs and discussed the procedure including the risks, benefits and alternatives for the proposed anesthesia with the patient or authorized representative who has indicated his/her understanding and acceptance.   Dental advisory given  Plan Discussed with: CRNA  Anesthesia Plan Comments:         Anesthesia Quick Evaluation

## 2014-11-16 NOTE — H&P (Signed)
Reason For Visit Bilateral nephrolithiasis   History of Present Illness 68 year old male with a extensive history of nephrolithiasis who has been followed by Dr. Brunilda PayorNesi for the last 30 years who presented with 5 weeks of gross hematuria. His evaluation revealed a large/partial staghorn calculi in the lower pole of the right kidney and a 11 mm interpolar stone on the left. The patient was referred to me for further management.  The patient denies any flank pain, progressive voiding symptoms, specifically dysuria or increasing frequency/urgency. He has had ongoing gross hematuria.   The patient is on medical management for his nephrolithiasis including potassium citrate and indapamide.  Stone composition: Calcium phosphate  Patient has a past surgical history of PCNL, shockwave lithotripsy, and ureteroscopy. [place] [facility].   Past Medical History Problems  1. History of Bladder Calculus 2. History of hypertension (Z86.79) 3. Personal history of prostate cancer (Z85.46) 4. Personal history of prostate cancer (Z85.46)  Surgical History Problems  1. History of Cystoscopy With Fragmentation Of Bladder Calculus 2. History of Cystoscopy With Fragmentation Of Bladder Calculus  Current Meds 1. Aspirin Low Dose 81 MG Oral Tablet;  Therapy: (Recorded:04Nov2015) to Recorded 2. Atenolol-Chlorthalidone 50-25 MG Oral Tablet;  Therapy: 29Apr2012 to Recorded 3. Beelith 362-20 MG Oral Tablet; Take 1 tablet twice daily;  Therapy: 13Nov2012 to (Evaluate:03Oct2015)  Requested for: 08Oct2014; Last  Rx:08Oct2014 Ordered 4. Cyclobenzaprine HCl - 10 MG Oral Tablet;  Therapy: 17Mar2015 to Recorded 5. Indapamide 1.25 MG Oral Tablet; TAKE 1 TABLET ONCE DAILY;  Therapy: 07Apr2014 to (Evaluate:15Oct2016)  Requested for: 22Oct2015; Last  Rx:21Oct2015 Ordered 6. Indapamide TABS;  Therapy: (Recorded:11Feb2008) to Recorded 7. Potassium Citrate  ER 10 MEQ (1080 MG) Oral Tablet Extended Release; TAKE 3 TABLET  Twice daily  Requested for: 22Oct2015; Last Rx:21Oct2015 Ordered 8. Urocit-K 10 10 MEQ (1080 MG) Oral Tablet Extended Release; TAKE 7 TABLET Daily;  Therapy: 03Oct2013 to (Evaluate:02Apr2015); Last Rx:07Apr2014 Ordered  Allergies Medication  1. Keflex TABS  Family History Problems  1. Family history of Family Health Status Number Of Children   1 son 2. No pertinent family history : Father  Social History Problems  1. Former smoker [phone](Z87.891) 2. Marital History - Currently Married 3. Occupation:   Customer service managerdirector of operations 4. Tobacco Use   smoke since 1987  Review of Systems Denies any shortness of breath, chest pain, changes to his bowel habits, night sweats, or recent weight changes.   Vitals Vital Signs [Data Includes: Last 1 Day]  Recorded: 04Nov2015 04:00PM  Blood Pressure: 145 / 92 Temperature: 97.8 F Heart Rate: 80  Physical Exam Constitutional: Well nourished and well developed . No acute distress.  Pulmonary: No respiratory distress, normal respiratory rhythm and effort and clear bilateral breath sounds.  Cardiovascular: Heart rate and rhythm are normal . The arterial pulses are normal. No peripheral edema.  Abdomen: The abdomen is obese. The abdomen is soft and nontender. No CVA tenderness.  Skin: Normal skin turgor, no visible rash and no visible skin lesions.  Neuro/Psych:. Mood and affect are appropriate.    Results/Data Urine [Data Includes: Last 1 Day]   04Nov2015  COLOR YELLOW   APPEARANCE CLOUDY   SPECIFIC GRAVITY 1.020   pH 7.0   GLUCOSE NEG mg/dL  BILIRUBIN NEG   KETONE NEG mg/dL  BLOOD LARGE   PROTEIN TRACE mg/dL  UROBILINOGEN 0.2 mg/dL  NITRITE NEG   LEUKOCYTE ESTERASE LARGE   SQUAMOUS EPITHELIAL/HPF RARE  WBC TNTC WBC/hpf  RBC TNTC RBC/hpf  BACTERIA MODERATE   CRYSTALS NONE SEEN   CASTS NONE SEEN    Patient's urinalysis has been reviewed, urine  culture.  Independently reviewed the patient's CAT scan dated 09/20/14 which shows bilateral nephrolithiasis. The right kidney has a lower pole partial staghorn calculi. There is no dilatation of the collecting system.   Assessment Assessed  1. Nephrolithiasis (N20.0)  Bilateral nephrolithiasis with a right partial stag. Patient is having gross hematuria but is otherwise asymptomatic.   Plan Health Maintenance  1. UA With REFLEX; [Do Not Release]; Status:Complete;   Done: 04Nov2015 03:13PM  Discussion/Summary I discussed the management of the patient's partial staghorn calculi on the right. We discussed treatment options including ureteroscopy and PCNL. Given the size of the stone, I recommended the patient undergo right PCNL. I went over the risks and benefits of the operation in detail with the patient as well as steps involved. The patient understands that he will be flipped prone and that we'll access his ureter through his urethra as part of the procedure. Further, the patient understands the risks of damage to the surrounding structures including liver, large intestine, duodenum, and bleeding from the kidney. In addition, I discussed the high likelihood of requiring a second look procedure. The patient has been through this before, and prefers not to have a stent. As such, we discussed leaving a nephrostomy tube and performing a second look through the access tract and then leaving a smaller nephrostomy tube and removing the larger tube once the patient is stone free. The patient understands that he will be admitted overnight. Having gone through the operation as well as the risks and benefits of it, the patient has agreed to proceed. We'll get this scheduled at his earliest convenience.

## 2014-11-16 NOTE — Transfer of Care (Signed)
Immediate Anesthesia Transfer of Care Note  Patient: Peter MuckleWilliam A Mccarey  Procedure(s) Performed: Procedure(s): RIGHT NEPHROLITHOTOMY PERCUTANEOUS WITH SURGEON ACCESS, DOUBLE J STENT (Right)  Patient Location: PACU  Anesthesia Type:General  Level of Consciousness: Patient easily awoken, sedated, comfortable, cooperative, following commands, responds to stimulation.   Airway & Oxygen Therapy: Patient spontaneously breathing, ventilating well, oxygen via simple oxygen mask.  Post-op Assessment: Report given to PACU RN, vital signs reviewed and stable, moving all extremities.   Post vital signs: Reviewed and stable.  Complications: No apparent anesthesia complications

## 2014-11-16 NOTE — Op Note (Signed)
Pre-operative diagnosis: right renal pelvis stones > 2.0  Post-operative diagnosis: as above   Procedure performed: cystoscopy, right retrograde pyelogram with interpretation, right percutaneous renal access, right nephrolithotomy, right nephrostogram, right ureteral stent placement   Surgeon: Dr. Ardis Hughs  Assistant: Dr. Curt Bears, MD  Anesthesia: General  Complications: None  Specimens: Stones were sent for stone composition analysis.  Findings: 1. Access into lower pole on top of stone.  No neph tube at the end of the case. 2. Retrograde revealed a normal caliber ureter, branched lower pole, long upper pole calyx, large filling defect in lower pole. 3. The patient had dystrophic calcifications within his prostate which were noted during the cystoscopic evaluation prior to retrograde pyelogram once the patient was prone.  Was unable to address these given the patient's position.  EBL: Approximately 200 cc   Indication: Peter Jackson is a 68 y.o. patient with Long history of nephrolithiasis. 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  After reviewing the management options for treatment, he elected to proceed with the above surgical procedure(s). We have discussed the potential benefits and risks of the procedure, side effects of the proposed treatment, the likelihood of the patient achieving the goals of the procedure, and any potential problems that might occur during the procedure or recuperation. Informed consent has been obtained.   Description:  Consent was obtained in the preoperative holding area. The patient was marked appropriately and then taken back to the operating room where she was intubated on the gurney. The patient was flipped prone onto the split leg OR table. Large jelly rolls were placed in the anterior axillary line on both sides allowing the patient's chest  and abdomen to fall inbetween. The patient was then prepped and draped in the routine sterile fashion in the right flank and perineal/vaginal area. A timeout was then held confirming the proper side and procedure as well as antibiotics were administered.  I then used the flexible cystoscope and passed gently into the patient's urethra under visual guidance. I then passed a wire through the right ureteral orifice into the right collecting system. I then exchanged the wire for a 5 [place] open-ended ureteral Pollock catheter. The Pollack catheter was then advanced up to the UPJ.  A retrograde pyelogram was performed with the above findings. I then turned my attention to the patient's right flank and obtaining percutaneous renal access.  A 16 French Foley catheter was placed in the patient's urethra at this time.  Using the C-arm rotated at 25 and the bulls-eye technique with an 18-gauge coaxial needle the lower pole posterior lateral calyx was targeted. Then rotating the C-arm AP depth of our needle was noted to be within the calyx and the inner part of the coaxial needle was removed. Urine was noted to return. A 0.38 sensor wire was then passed through the sheath of the coaxial needle and into the right renal collecting system. The wire was then passed down the ureter and into the bladder using fluoroscopic guide and the sheath of the needle was removed. An angiographic catheter was then advanced into the bladder and the wire removed. A Super Stiff wire was then passed into the angiographic catheter and the angiographic catheter removed. A 9 French peel-away dilator was then advanced over the Super Stiff wire and passed into the renal pelvis and across the UPJ under fluoroscopic guidance. The inner part of  this dilator was removed and the 0.38 sensor wire was passed alongside the Super Stiff wire through the dilator and into the right ureter and down into the bladder. The angiographic catheter again was passed  over the guidewire and advanced into the bladder, the wire was then removed. A Super Stiff wire was then passed through angiographic catheter and angiographic catheter removed. The outer part of the sheath was then removed, establishing 2 superstiff wires through the lower pole calyx and into the bladder.   The 52 French NephroMax balloon was then passed over one of the Super Stiff wires and the tip guided down into the right upper pole calyx. The balloon was then inflated to approximately 12 atm, and once there was no waist noted under fluoroscopy the access sheath was advanced over the balloon. The balloon was then removed. The wires were then placed back into the sheaths and snapped to the drape.   Using the rigid nephroscope to explore the renal pelvis I encountered the large partial staghorn calculus and several small fragments which The majority of stone was removed with the lithoclast. Then using a flexible cystoscope to navigate the remaining calyces of the kidney multiple smaller stone fragments encountered, mostly in the lower pole calyces. Contrast was injected through the cystoscope and the calyces systematically inspected under fluoroscopic guidance to ensure that all stone fragments had been removed.    I then advanced a 0.38 sensor wire through the open-ended 5 French ureteral Pollock from the patient's urethra and exchange the wire leaving the wire in the renal pelvis.  I then advanced the flexible ureteroscope over the wire and into the right collecting system.  I then performed retrograde ureteroscopy to ensure that all calyces had been inspected.  Once I was confident that I had seen all the calyces in that all large stone fragments had been removed I removed the ureteroscope.  Then, Again, using the flexible ureteroscope the ureter was navigated in antegrade fashion. All stone fragments were pushed from the ureter into the bladder. Once the ureter was clear the scope was advanced into the  bladder and a 0.38 sensor wire was left in the bladder and the scope backed out over wire. The sensor wire was then backloaded over the rigid nephroscope using the stent pusher and a 26cm x 6 French double-J ureteral stent was passed antegrade over the sensor wire down into the bladder under fluoroscopic guidance. Once the stent was in the bladder the wire was gently pulled back and a nice curl noted in the bladder. The wire completely removed from the stent, and nice curl on the proximal end of the stent was noted in the renal pelvis. The sheath was then backed out slowly to ensure that all calyces had been inspected and there was nothing behind the sheath.   A 22 [place] council tip catheter was then passed over one of the Super Stiff wires through the sheath and into the renal pelvis. The sheath was then backed out of the kidney and cut off the red rubber catheter. A nephrostogram was then performed confirming the position of our nephrostomy tube and reassuring that there were no longer any filling defects And there was no extravasation of contrast. After several minutes of direct pressure and observation I noted that there was no significant bleeding from the nephrostomy tube or around the nephrostomy tube tract. As such, I remove the nephrostomy tube as well as the safety wire. 25 cc of local anesthesia was then injected  into the patient's wound, and the wound was closed with 3-0 nylon in 2 vertical mattress sutures. The incision was then padded using a bundle of 4 x 4's and Hypafix tape. Patient was subsequently rolled over to the supine position and extubated. She was returned to the PACU in excellent condition. At the end of the case all lap and needle and sponges were accounted for. There are no perioperative complications.

## 2014-11-17 ENCOUNTER — Observation Stay (HOSPITAL_COMMUNITY): Payer: BC Managed Care – PPO

## 2014-11-17 DIAGNOSIS — N200 Calculus of kidney: Secondary | ICD-10-CM | POA: Diagnosis not present

## 2014-11-17 LAB — BASIC METABOLIC PANEL
ANION GAP: 16 — AB (ref 5–15)
ANION GAP: 14 (ref 5–15)
BUN: 29 mg/dL — ABNORMAL HIGH (ref 6–23)
BUN: 32 mg/dL — ABNORMAL HIGH (ref 6–23)
CALCIUM: 8.9 mg/dL (ref 8.4–10.5)
CALCIUM: 9.4 mg/dL (ref 8.4–10.5)
CO2: 26 meq/L (ref 19–32)
CO2: 27 meq/L (ref 19–32)
CREATININE: 1.9 mg/dL — AB (ref 0.50–1.35)
CREATININE: 1.88 mg/dL — AB (ref 0.50–1.35)
Chloride: 94 mEq/L — ABNORMAL LOW (ref 96–112)
Chloride: 94 mEq/L — ABNORMAL LOW (ref 96–112)
GFR calc Af Amer: 40 mL/min — ABNORMAL LOW (ref >90)
GFR calc Af Amer: 41 mL/min — ABNORMAL LOW (ref >90)
GFR, EST NON AFRICAN AMERICAN: 35 mL/min — AB (ref >90)
GFR, EST NON AFRICAN AMERICAN: 35 mL/min — AB (ref >90)
Glucose, Bld: 155 mg/dL — ABNORMAL HIGH (ref 70–99)
Glucose, Bld: 142 mg/dL — ABNORMAL HIGH (ref 70–99)
Potassium: 3.6 mEq/L — ABNORMAL LOW (ref 3.7–5.3)
Potassium: 4.1 mEq/L (ref 3.7–5.3)
Sodium: 136 mEq/L — ABNORMAL LOW (ref 137–147)
Sodium: 135 mEq/L — ABNORMAL LOW (ref 137–147)

## 2014-11-17 LAB — CBC
HCT: 42.2 % (ref 39.0–52.0)
Hemoglobin: 14.3 g/dL (ref 13.0–17.0)
MCH: 29.8 pg (ref 26.0–34.0)
MCHC: 33.9 g/dL (ref 30.0–36.0)
MCV: 87.9 fL (ref 78.0–100.0)
PLATELETS: 216 10*3/uL (ref 150–400)
RBC: 4.8 MIL/uL (ref 4.22–5.81)
RDW: 13 % (ref 11.5–15.5)
WBC: 17.8 10*3/uL — ABNORMAL HIGH (ref 4.0–10.5)

## 2014-11-17 MED ORDER — PHENAZOPYRIDINE HCL 200 MG PO TABS: 200.0000 mg | ORAL_TABLET | Freq: Three times a day (TID) | ORAL | Status: DC | PRN

## 2014-11-17 MED ORDER — SENNOSIDES-DOCUSATE SODIUM 8.6-50 MG PO TABS: 2.0000 | ORAL_TABLET | Freq: Every day | ORAL | Status: AC

## 2014-11-17 MED ORDER — OXYCODONE-ACETAMINOPHEN 5-325 MG PO TABS: 1.0000 | ORAL_TABLET | ORAL | Status: DC | PRN

## 2014-11-17 MED ORDER — TROSPIUM CHLORIDE ER 60 MG PO CP24: 60.0000 mg | ORAL_CAPSULE | Freq: Every day | ORAL | Status: DC

## 2014-11-17 NOTE — Progress Notes (Signed)
Utilization Review completed.  

## 2014-11-17 NOTE — Discharge Instructions (Signed)
Discharge instructions following PCNL  Call your doctor for: Fevers greater than 100.5 Severe nausea or vomiting Increasing pain not controlled by pain medication Increasing redness or drainage from incisions Decreased urine output or a catheter is no longer draining  The number for questions is [phone].  Activity: Gradually increase activity with short frequent walks, 3-4 times a day.  Avoid strenuous activities, like sports, lawn-mowing, or heavy lifting (more than 10-15 pounds).  Wear loose, comfortable clothing that pull or kink the tube or tubes.  Do not drive while taking pain medication, or until your doctor permitts it.  Bathing and dressing changes: You should not shower for 48 hours after surgery.  Do not soak your back in a bathtub.  Diet: It is extremely important to drink plenty of fluids after surgery, especially water.  You may resume your regular diet, unless otherwise instructed.  Medications: 1. Resume all your other meds from home - except do not take any extra narcotic pain meds that you may have at home.  2. Trospium is to prevent bladder spasms and help reduce urinary frequency. 3. Pyridium is to help with the burning/stinging when you urinate. 4. Percocet is for moderate/severe pain, otherwise taking upto 1000mg  every 6 hours of plainTylenol will help treat your pain.  Do not take both at the same time. 5. Hold Aspirin until stent has been removed.  Follow-up appointments: Follow-up appointment will be scheduled for stent removal in the next 2 weeks.  Someone from his office will call to get this scheduled.

## 2014-11-17 NOTE — Progress Notes (Signed)
MD called and made aware of lab findings. MD stated to go ahead with discharge to home after foley catheter removed and pt voids.

## 2014-11-17 NOTE — Progress Notes (Signed)
Urology Inpatient Progress Report S/p right PCNL, POD#1  Intv/Subj: Foley occluded this AM, 1200cc drained immediately, blood tinged. No real complaints of pain. Creatinine elevated this AM.  Past Medical History  Diagnosis Date  . Renal stone BILATERAL  . Bladder stones   . History of kidney stones   . History of diverticulitis of colon   . Urgency of urination   . Dysuria   . Hypertension     not on medicationin 2-3 years   . Cancer     prostate cancer    Current Facility-Administered Medications  Medication Dose Route Frequency Provider Last Rate Last Dose  . 0.9 %  sodium chloride infusion   Intravenous Continuous Garen LahEdgar W Kirby, MD 100 mL/hr at 11/17/14 0134    . cyclobenzaprine (FLEXERIL) tablet 10 mg  10 mg Oral Daily Garen LahEdgar W Kirby, MD      . diphenhydrAMINE (BENADRYL) injection 12.5 mg  12.5 mg Intravenous Q6H PRN Garen LahEdgar W Kirby, MD       Or  . diphenhydrAMINE (BENADRYL) 12.5 MG/5ML elixir 12.5 mg  12.5 mg Oral Q6H PRN Garen LahEdgar W Kirby, MD      . indapamide (LOZOL) tablet 2.5 mg  2.5 mg Oral Daily Garen LahEdgar W Kirby, MD      . morphine 2 MG/ML injection 2-4 mg  2-4 mg Intravenous Q2H PRN Garen LahEdgar W Kirby, MD   2 mg at 11/16/14 1411  . ondansetron (ZOFRAN) injection 4 mg  4 mg Intravenous Q4H PRN Garen LahEdgar W Kirby, MD   4 mg at 11/16/14 1511  . oxyCODONE-acetaminophen (PERCOCET/ROXICET) 5-325 MG per tablet 1-2 tablet  1-2 tablet Oral Q4H PRN Garen LahEdgar W Kirby, MD   2 tablet at 11/17/14 0913  . senna-docusate (Senokot-S) tablet 2 tablet  2 tablet Oral QHS Garen LahEdgar W Kirby, MD   2 tablet at 11/16/14 2108     Objective: Vital: Filed Vitals:   11/16/14 1330 11/16/14 1351 11/16/14 2156 11/17/14 0300  BP: 101/55 108/54 95/42 102/56  Pulse: 61 69 85 73  Temp: 98 F (36.7 C) 97.5 F (36.4 C) 98.5 F (36.9 C) 98.8 F (37.1 C)  TempSrc:   Oral Oral  Resp: 15 15 16 18   Height:      Weight:      SpO2: 96% 97% 93% 92%   I/Os: I/O last 3 completed shifts: In: 1360 [P.O.:360; I.V.:1000] Out:  2300 [Urine:2100; Blood:200]  Physical Exam:  General: Patient is in no apparent distress Lungs: Normal respiratory effort, chest expands symmetrically. GI: The abdomen is soft and nontender without mass. Dressings c/d/i Ext: lower extremities symmetric  Lab Results:  Recent Labs  11/17/14 0431  WBC 17.8*  HGB 14.3  HCT 42.2    Recent Labs  11/17/14 0431  NA 136*  K 3.6*  CL 94*  CO2 26  GLUCOSE 155*  BUN 29*  CREATININE 1.90*  CALCIUM 8.9   No results for input(s): LABPT, INR in the last 72 hours. No results for input(s): LABURIN in the last 72 hours. Results for orders placed or performed during the hospital encounter of 11/06/14  Urine culture     Status: None   Collection Time: 11/06/14  2:47 PM  Result Value Ref Range Status   Specimen Description URINE, RANDOM  Final   Special Requests NONE  Final   Culture  Setup Time   Final    11/06/2014 22:50 Performed at [facility]    Colony Count   Final    >=100,000 COLONIES/ML  [facility]formed at [facility]    Culture   Final    Multiple bacterial morphotypes present, none predominant. Suggest appropriate recollection if clinically indicated. Performed at [facility]    Report Status 11/07/2014 FINAL  Final    Studies/Results: CT scan this AM showed a punctate calcification in the right lower pole, but otherwise no stones in upper tract.  Possibly some stone fragments in the bladder.  Assessment: 1 Day Post-Op doing ok. Elevated kidney function today - hopefully transient and related to an occluded catheter. Small punctate calcification within the lower pole of the right kidney but otherwise stone free  Plan: Keep catheter this AM Repeat BMP this PM Potential d/c this PM.  Crist FatHERRICK, Keiyon Plack W 11/17/2014, 10:32 AM

## 2014-11-18 NOTE — Discharge Summary (Signed)
Date of admission: 11/16/2014  Date of discharge: 11/18/2014  Admission diagnosis: right partial staghorn stone  Discharge diagnosis: same, s/p right PCNL  Secondary diagnoses:  Patient Active Problem List   Diagnosis Date Noted  . Nephrolithiasis 11/16/2014    History and Physical: For full details, please see admission history and physical. Briefly, Peter Jackson is a 68 y.o. year old patient with large right sided stone burden.   Hospital Course: Patient tolerated the procedure well.  He was then transferred to the floor after an uneventful PACU stay.  His hospital course was uncomplicated.  On POD#1  he had met discharge criteria: was eating a regular diet, was up and ambulating independently,  pain was well controlled, was voiding without a catheter, and was ready to for discharge.   Laboratory values:   Recent Labs  11/17/14 0431  WBC 17.8*  HGB 14.3  HCT 42.2    Recent Labs  11/17/14 0431 11/17/14 1352  NA 136* 135*  K 3.6* 4.1  CL 94* 94*  CO2 26 27  GLUCOSE 155* 142*  BUN 29* 32*  CREATININE 1.90* 1.88*  CALCIUM 8.9 9.4   No results for input(s): LABPT, INR in the last 72 hours. No results for input(s): LABURIN in the last 72 hours. Results for orders placed or performed during the hospital encounter of 11/06/14  Urine culture     Status: None   Collection Time: 11/06/14  2:47 PM  Result Value Ref Range Status   Specimen Description URINE, RANDOM  Final   Special Requests NONE  Final   Culture  Setup Time   Final    11/06/2014 22:50 Performed at [facility]   Final    >=100,000 COLONIES/ML Performed at [facility]    Culture   Final    Multiple bacterial morphotypes present, none predominant. Suggest appropriate recollection if clinically indicated. Performed at [facility]    Report Status 11/07/2014 FINAL  Final    Disposition: Home  Discharge instruction: The patient was instructed to be  ambulatory but told to refrain from heavy lifting, strenuous activity, or driving.   Discharge medications:   Medication List    STOP taking these medications        aspirin 81 MG tablet      TAKE these medications        cholecalciferol 1000 UNITS tablet  Commonly known as:  VITAMIN D  Take 2,000 Units by mouth daily.     cyclobenzaprine 10 MG tablet  Commonly known as:  FLEXERIL  Take 10 mg by mouth daily.     Fish Oil 1000 MG Caps  Take 1 capsule by mouth 2 (two) times daily.     indapamide 2.5 MG tablet  Commonly known as:  LOZOL  Take 2.5 mg by mouth every morning.     multivitamin tablet  Take 1 tablet by mouth daily.     oxyCODONE-acetaminophen 5-325 MG per tablet  Commonly known as:  PERCOCET/ROXICET  Take 1-2 tablets by mouth every 4 (four) hours as needed for moderate pain.     phenazopyridine 200 MG tablet  Commonly known as:  PYRIDIUM  Take 1 tablet (200 mg total) by mouth 3 (three) times daily as needed for pain.     potassium citrate 10 MEQ (1080 MG) SR tablet  Commonly known as:  UROCIT-K  Take 30 mEq by mouth 2 (two) times daily.     POTASSIUM CITRATE PO  Take 30 mg by mouth 2 (two) times daily.     senna-docusate 8.6-50 MG per tablet  Commonly known as:  Senokot-S  Take 2 tablets by mouth at bedtime.     Trospium Chloride 60 MG Cp24  Take 1 capsule (60 mg total) by mouth daily.        Followup:      Follow-up Information    Follow up with Ardis Hughs, MD In 2 weeks.   Specialty:  Urology   Contact information:   [address] [place] [address] [phone]

## 2014-11-19 ENCOUNTER — Other Ambulatory Visit: Payer: Self-pay | Admitting: Urology

## 2014-11-19 ENCOUNTER — Encounter (HOSPITAL_COMMUNITY): Payer: Self-pay | Admitting: Urology

## 2014-11-20 ENCOUNTER — Encounter (HOSPITAL_BASED_OUTPATIENT_CLINIC_OR_DEPARTMENT_OTHER): Payer: Self-pay | Admitting: *Deleted

## 2014-11-21 ENCOUNTER — Encounter (HOSPITAL_BASED_OUTPATIENT_CLINIC_OR_DEPARTMENT_OTHER): Payer: Self-pay | Admitting: *Deleted

## 2014-11-22 ENCOUNTER — Encounter (HOSPITAL_BASED_OUTPATIENT_CLINIC_OR_DEPARTMENT_OTHER): Payer: Self-pay | Admitting: *Deleted

## 2014-11-22 NOTE — Progress Notes (Signed)
   11/22/14 1027  OBSTRUCTIVE SLEEP APNEA  Have you ever been diagnosed with sleep apnea through a sleep study? No  Do you snore loudly (loud enough to be heard through closed doors)?  1  Do you often feel tired, fatigued, or sleepy during the daytime? 0  Has anyone observed you stop breathing during your sleep? 0  Do you have, or are you being treated for high blood pressure? 1  BMI more than 35 kg/m2? 1  Age over 67 years old? 1  Neck circumference greater than 40 cm/16 inches? 1  Gender: 1  Obstructive Sleep Apnea Score 6  Score 4 or greater  Results sent to PCP

## 2014-11-22 NOTE — Progress Notes (Signed)
NPO AFTER MN. ARRIVE AT 1115. CURRENT LAB RESULTS AND EKG IN CHART AND EPIC. MAY TAKE OXYCODONE IF NEEDED W/ SIPS OF WATER AM DOS.

## 2014-11-28 ENCOUNTER — Encounter (HOSPITAL_BASED_OUTPATIENT_CLINIC_OR_DEPARTMENT_OTHER): Payer: Self-pay | Admitting: *Deleted

## 2014-11-28 ENCOUNTER — Encounter (HOSPITAL_BASED_OUTPATIENT_CLINIC_OR_DEPARTMENT_OTHER)
Admission: RE | Disposition: A | Discharge status: Home / Self Care | Payer: Self-pay | Source: Ambulatory Visit | Attending: Urology

## 2014-11-28 ENCOUNTER — Ambulatory Visit (HOSPITAL_BASED_OUTPATIENT_CLINIC_OR_DEPARTMENT_OTHER)
Admission: RE | Admit: 2014-11-28 | Discharge: 2014-11-28 | Disposition: A | Discharge status: Home / Self Care | Payer: BC Managed Care – PPO | Source: Ambulatory Visit | Attending: Urology | Admitting: Urology

## 2014-11-28 ENCOUNTER — Ambulatory Visit (HOSPITAL_BASED_OUTPATIENT_CLINIC_OR_DEPARTMENT_OTHER): Payer: BC Managed Care – PPO | Admitting: Anesthesiology

## 2014-11-28 DIAGNOSIS — G4730 Sleep apnea, unspecified: Secondary | ICD-10-CM | POA: Insufficient documentation

## 2014-11-28 DIAGNOSIS — I739 Peripheral vascular disease, unspecified: Secondary | ICD-10-CM | POA: Insufficient documentation

## 2014-11-28 DIAGNOSIS — N4289 Other specified disorders of prostate: Secondary | ICD-10-CM | POA: Diagnosis not present

## 2014-11-28 DIAGNOSIS — Z87442 Personal history of urinary calculi: Secondary | ICD-10-CM | POA: Diagnosis not present

## 2014-11-28 DIAGNOSIS — Z6839 Body mass index (BMI) 39.0-39.9, adult: Secondary | ICD-10-CM | POA: Insufficient documentation

## 2014-11-28 DIAGNOSIS — R319 Hematuria, unspecified: Secondary | ICD-10-CM | POA: Diagnosis present

## 2014-11-28 DIAGNOSIS — Z79899 Other long term (current) drug therapy: Secondary | ICD-10-CM | POA: Diagnosis not present

## 2014-11-28 DIAGNOSIS — N200 Calculus of kidney: Secondary | ICD-10-CM

## 2014-11-28 DIAGNOSIS — I10 Essential (primary) hypertension: Secondary | ICD-10-CM | POA: Insufficient documentation

## 2014-11-28 DIAGNOSIS — Z8546 Personal history of malignant neoplasm of prostate: Secondary | ICD-10-CM | POA: Insufficient documentation

## 2014-11-28 DIAGNOSIS — Z87891 Personal history of nicotine dependence: Secondary | ICD-10-CM | POA: Diagnosis not present

## 2014-11-28 HISTORY — PX: TRANSURETHRAL RESECTION OF PROSTATE: SHX73

## 2014-11-28 HISTORY — DX: Other specified personal risk factors, not elsewhere classified: Z91.89

## 2014-11-28 HISTORY — DX: Abdominal aortic aneurysm, without rupture: I71.4

## 2014-11-28 HISTORY — DX: Congestion and hemorrhage of prostate: N42.1

## 2014-11-28 HISTORY — DX: Infrarenal abdominal aortic aneurysm, without rupture: I71.43

## 2014-11-28 HISTORY — DX: Diverticulosis of large intestine without perforation or abscess without bleeding: K57.30

## 2014-11-28 HISTORY — PX: CYSTOSCOPY W/ URETERAL STENT REMOVAL: SHX1430

## 2014-11-28 HISTORY — DX: Calculus of kidney: N20.0

## 2014-11-28 HISTORY — DX: Personal history of malignant neoplasm of prostate: Z85.46

## 2014-11-28 SURGERY — REMOVAL, STENT, URETER, CYSTOSCOPIC
Anesthesia: General | Site: Ureter | Laterality: Right

## 2014-11-28 MED ORDER — CIPROFLOXACIN IN D5W 400 MG/200ML IV SOLN
400.0000 mg | INTRAVENOUS | Status: AC
  Administered 2014-11-28: 400 mg via INTRAVENOUS
  Filled 2014-11-28: qty 200

## 2014-11-28 MED ORDER — ONDANSETRON HCL 4 MG/2ML IJ SOLN
INTRAMUSCULAR | Status: DC | PRN
  Administered 2014-11-28: 4 mg via INTRAVENOUS

## 2014-11-28 MED ORDER — ACETAMINOPHEN 10 MG/ML IV SOLN
INTRAVENOUS | Status: DC | PRN
  Administered 2014-11-28: 1000 mg via INTRAVENOUS

## 2014-11-28 MED ORDER — LACTATED RINGERS IV SOLN
INTRAVENOUS | Status: DC
  Administered 2014-11-28: 11:00:00 via INTRAVENOUS
  Filled 2014-11-28: qty 1000

## 2014-11-28 MED ORDER — BELLADONNA ALKALOIDS-OPIUM 16.2-60 MG RE SUPP
RECTAL | Status: DC | PRN
  Administered 2014-11-28: 1 via RECTAL

## 2014-11-28 MED ORDER — PROPOFOL 10 MG/ML IV BOLUS
INTRAVENOUS | Status: DC | PRN
  Administered 2014-11-28: 50 mg via INTRAVENOUS
  Administered 2014-11-28: 250 mg via INTRAVENOUS

## 2014-11-28 MED ORDER — BELLADONNA ALKALOIDS-OPIUM 16.2-60 MG RE SUPP
RECTAL | Status: AC
  Filled 2014-11-28: qty 1

## 2014-11-28 MED ORDER — MIDAZOLAM HCL 5 MG/5ML IJ SOLN
INTRAMUSCULAR | Status: DC | PRN
  Administered 2014-11-28: 2 mg via INTRAVENOUS

## 2014-11-28 MED ORDER — FENTANYL CITRATE 0.05 MG/ML IJ SOLN
INTRAMUSCULAR | Status: AC
  Filled 2014-11-28: qty 4

## 2014-11-28 MED ORDER — FENTANYL CITRATE 0.05 MG/ML IJ SOLN
INTRAMUSCULAR | Status: DC | PRN
  Administered 2014-11-28: 50 ug via INTRAVENOUS

## 2014-11-28 MED ORDER — MIDAZOLAM HCL 2 MG/2ML IJ SOLN
INTRAMUSCULAR | Status: AC
  Filled 2014-11-28: qty 2

## 2014-11-28 MED ORDER — DEXAMETHASONE SODIUM PHOSPHATE 4 MG/ML IJ SOLN
INTRAMUSCULAR | Status: DC | PRN
  Administered 2014-11-28: 10 mg via INTRAVENOUS

## 2014-11-28 MED ORDER — OXYCODONE-ACETAMINOPHEN 5-325 MG PO TABS: 1.0000 | ORAL_TABLET | ORAL | Status: AC | PRN

## 2014-11-28 MED ORDER — SODIUM CHLORIDE 0.9 % IR SOLN
Status: DC | PRN
  Administered 2014-11-28: 6000 mL via INTRAVESICAL

## 2014-11-28 MED ORDER — LIDOCAINE HCL (CARDIAC) 20 MG/ML IV SOLN
INTRAVENOUS | Status: DC | PRN
  Administered 2014-11-28: 80 mg via INTRAVENOUS

## 2014-11-28 MED ORDER — CIPROFLOXACIN IN D5W 400 MG/200ML IV SOLN
INTRAVENOUS | Status: AC
  Filled 2014-11-28: qty 200

## 2014-11-28 SURGICAL SUPPLY — 19 items
20 FR 5CC 3WAY FOLEY ×3 IMPLANT
BAG DRAIN URO-CYSTO SKYTR STRL (DRAIN) ×3 IMPLANT
BAG URINE DRAINAGE (UROLOGICAL SUPPLIES) ×3 IMPLANT
CANISTER SUCT LVC 12 LTR MEDI- (MISCELLANEOUS) ×3 IMPLANT
CLOTH BEACON ORANGE TIMEOUT ST (SAFETY) ×3 IMPLANT
DRAPE CAMERA CLOSED 9X96 (DRAPES) ×3 IMPLANT
ELECT LOOP MED HF 24F 12D CBL (CLIP) ×3 IMPLANT
GLOVE BIO SURGEON STRL SZ 6.5 (GLOVE) ×3 IMPLANT
GLOVE BIO SURGEON STRL SZ7.5 (GLOVE) ×3 IMPLANT
GLOVE BIOGEL PI IND STRL 6.5 (GLOVE) ×4 IMPLANT
GLOVE BIOGEL PI INDICATOR 6.5 (GLOVE) ×2
GLOVE SURG SS PI 7.5 STRL IVOR (GLOVE) ×6 IMPLANT
GOWN STRL REUS W/ TWL XL LVL3 (GOWN DISPOSABLE) ×2 IMPLANT
GOWN STRL REUS W/TWL LRG LVL3 (GOWN DISPOSABLE) ×3 IMPLANT
GOWN STRL REUS W/TWL XL LVL3 (GOWN DISPOSABLE) ×4 IMPLANT
HOLDER FOLEY CATH W/STRAP (MISCELLANEOUS) ×3 IMPLANT
IV NS IRRIG 3000ML ARTHROMATIC (IV SOLUTION) ×6 IMPLANT
PACK CYSTO (CUSTOM PROCEDURE TRAY) ×3 IMPLANT
PLUG CATH AND CAP STER (CATHETERS) ×3 IMPLANT

## 2014-11-28 NOTE — Discharge Instructions (Signed)
Transurethral Resection of Prostate  General instructions:     Your recent bladder surgery requires very little post hospital care but some definite precautions.  Despite the fact that no skin incisions were used, the area around the prostate incisions are raw and covered with scabs to promote healing and prevent bleeding. Certain precautions are needed to insure that the scabs are not disturbed over the next 2-4 weeks while the healing proceeds.  Because the raw surface inside your prostate and the irritating effects of urine you may expect frequency of urination and/or urgency (a stronger desire to urinate) and perhaps even getting up at night more often. This will usually resolve or improve slowly over the healing period. You may see some blood in your urine over the first 6 weeks. Do not be alarmed, even if the urine was clear for a while. Get off your feet and drink lots of fluids until clearing occurs. If you start to pass clots or don't improve call us.  Diet:  You may return to your normal diet immediately. Because of the raw surface of your bladder, alcohol, spicy foods, foods high in acid and drinks with caffeine may cause irritation or frequency and should be used in moderation. To keep your urine flowing freely and avoid constipation, drink plenty of fluids during the day (8-10 glasses). Tip: Avoid cranberry juice because it is very acidic.  Activity:  Your physical activity doesn't need to be restricted. However, if you are very active, you may see some blood in the urine. We suggest that you reduce your activity under the circumstances until the bleeding has stopped.  Bowels:  It is important to keep your bowels regular during the postoperative period. Straining with bowel movements can cause bleeding. A bowel movement every other day is reasonable. Use a mild laxative if needed, such as milk of magnesia 2-3 tablespoons, or 2 Dulcolax tablets. Call if you continue to have problems.  If you had been taking narcotics for pain, before, during or after your surgery, you may be constipated. Take a laxative if necessary.    Medication:  You should resume your pre-surgery medications unless told not to. In addition you may be given an antibiotic to prevent or treat infection. Antibiotics are not always necessary. All medication should be taken as prescribed until the bottles are finished unless you are having an unusual reaction to one of the drugs.   Post Anesthesia Home Care Instructions  Activity: Get plenty of rest for the remainder of the day. A responsible adult should stay with you for 24 hours following the procedure.  For the next 24 hours, DO NOT: -Drive a car -Advertising copywriterperate machinery -Drink alcoholic beverages -Take any medication unless instructed by your physician -Make any legal decisions or sign important papers.  Meals: Start with liquid foods such as gelatin or soup. Progress to regular foods as tolerated. Avoid greasy, spicy, heavy foods. If nausea and/or vomiting occur, drink only clear liquids until the nausea and/or vomiting subsides. Call your physician if vomiting continues.  Special Instructions/Symptoms: Your throat may feel dry or sore from the anesthesia or the breathing tube placed in your throat during surgery. If this causes discomfort, gargle with warm salt water. The discomfort should disappear within 24 hours. Transurethral Resection of the Prostate Care After Refer to this sheet in the next few weeks. These instructions provide you with information on caring for yourself after your procedure. Your caregiver also may give you specific instructions. Your treatment has been  planned according to current medical practices, but complications sometimes occur. Call your caregiver if you have any problems or questions after your procedure. HOME CARE INSTRUCTIONS  Recovery can take 4-6 weeks. Avoid alcohol, caffeinated drinks, and spicy foods for 2 weeks  after your procedure. Drink enough fluids to keep your urine clear or pale yellow. Urinate as soon as you feel the urge to do so. Do not try to hold your urine for long periods of time. During recovery you may experience pain caused by bladder spasms, which result in a very intense urge to urinate. Take all medicines as directed by your caregiver, including medicines for pain. Try to limit the amount of pain medicines you take because it can cause constipation. If you do become constipated, do not strain to move your bowels. Straining can increase bleeding. Constipation can be minimized by increasing the amount fluids and fiber in your diet. Your caregiver also may prescribe a stool softener. Do not lift heavy objects (more than 5 lb [2.25 kg]) or perform exercises that cause you to strain for at least 1 month after your procedure. When sitting, you may want to sit in a soft chair or use a cushion. For the first 10 days after your procedure, avoid the following activities:  Running.  Strenuous work.  Long walks.  Riding in a car for extended periods.  Sex. SEEK MEDICAL CARE IF:  You have difficulty urinating.  You have blood in your urine that does not go away after you rest or increase your fluid intake.  You have swelling in your penis or scrotum. SEEK IMMEDIATE MEDICAL CARE IF:   You are suddenly unable to urinate.  You notice blood clots in your urine.  You have chills.  You have a fever.  You have pain in your back or lower abdomen.  You have pain or swelling in your legs. MAKE SURE YOU:   Understand these instructions.  Will watch your condition.  Will get help right away if you are not doing well or get worse. Document Released: 11/23/2005 Document Revised: 08/17/2012 Document Reviewed: 01/01/2012 [facility] Patient Information 2015 [employer] [place]. This information is not intended to replace advice given to you by your health care provider. Make sure you discuss any  questions you have with your health care provider.

## 2014-11-28 NOTE — Anesthesia Preprocedure Evaluation (Addendum)
Anesthesia Evaluation  Patient identified by MRN, date of birth, ID band Patient awake    Reviewed: Allergy & Precautions, H&P , NPO status , Patient's Chart, lab work & pertinent test results  History of Anesthesia Complications Negative for: history of anesthetic complications  Airway Mallampati: II  TM Distance: >3 FB Neck ROM: Full    Dental  (+) Dental Advisory Given, Caps,  All front upper are capped:   Pulmonary sleep apnea , former smoker,  Stop bang 6   Pulmonary exam normal       Cardiovascular hypertension, Pt. on medications + Peripheral Vascular Disease Rhythm:Regular Rate:Normal  AAA 3 cm   Neuro/Psych negative neurological ROS  negative psych ROS   GI/Hepatic negative GI ROS, Neg liver ROS,   Endo/Other  negative endocrine ROSMorbid obesity  Renal/GU Renal diseaseCRT 1.88  negative genitourinary   Musculoskeletal negative musculoskeletal ROS (+)   Abdominal (+) + obese,   Peds negative pediatric ROS (+)  Hematology negative hematology ROS (+)   Anesthesia Other Findings [place]  Reproductive/Obstetrics negative OB ROS                       Anesthesia Physical Anesthesia Plan  ASA: III  Anesthesia Plan: General   Post-op Pain Management:    Induction: Intravenous  Airway Management Planned: LMA  Additional Equipment:   Intra-op Plan:   Post-operative Plan: Extubation in OR  Informed Consent: I have reviewed the patients History and Physical, chart, labs and discussed the procedure including the risks, benefits and alternatives for the proposed anesthesia with the patient or authorized representative who has indicated his/her understanding and acceptance.   Dental advisory given  Plan Discussed with: CRNA and Surgeon  Anesthesia Plan Comments:        Anesthesia Quick Evaluation                                  Anesthesia Evaluation  Patient identified  by MRN, date of birth, ID band Patient awake    Reviewed: Allergy & Precautions, H&P , NPO status , Patient's Chart, lab work & pertinent test results  Airway Mallampati: II  TM Distance: >3 FB Neck ROM: Full    Dental  (+) Dental Advisory Given, Caps Full crowns/bridges.:   Pulmonary former smoker,  breath sounds clear to auscultation  Pulmonary exam normal       Cardiovascular Exercise Tolerance: Good hypertension, Pt. on medications and Pt. on home beta blockers Rhythm:Regular Rate:Normal  He did restart his blood pressure medicine 2 weeks ago. 119/78 today.  No cardiopulmonary symptoms.   Neuro/Psych negative neurological ROS  negative psych ROS   GI/Hepatic negative GI ROS, Neg liver ROS,   Endo/Other  negative endocrine ROSMorbid obesity  Renal/GU Renal disease  negative genitourinary   Musculoskeletal negative musculoskeletal ROS (+)   Abdominal (+) + obese,   Peds negative pediatric ROS (+)  Hematology negative hematology ROS (+)   Anesthesia Other Findings   Reproductive/Obstetrics negative OB ROS                            Anesthesia Physical Anesthesia Plan  ASA: III  Anesthesia Plan: General   Post-op Pain Management:    Induction: Intravenous  Airway Management Planned: Oral ETT  Additional Equipment:   Intra-op Plan:   Post-operative Plan: Extubation in OR  Informed Consent:  I have reviewed the patients History and Physical, chart, labs and discussed the procedure including the risks, benefits and alternatives for the proposed anesthesia with the patient or authorized representative who has indicated his/her understanding and acceptance.   Dental advisory given  Plan Discussed with: CRNA  Anesthesia Plan Comments:         Anesthesia Quick Evaluation

## 2014-11-28 NOTE — Op Note (Signed)
Preoperative diagnosis:  1. Right ureteral stent 2. 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  Surgeon: Crist FatBenjamin W. [name], MD  Anesthesia: General  Complications: None  Intraoperative findings: the stent was easily removed in routine fashion. There were 4 or 5 small calcifications embedded in small diverticuli in the previously resected prostatic urethra. These were removed along with some of the sidewall tissue in the prostatic urethra.  EBL: Minimal  Specimens: TUR P Chips, prostatic calcifications  Indication: Peter Jackson is a 68 y.o. patwho is status post right percutaneous nephrolithotomy. During that procedure he was noted that the patient had dystrophic calcifications within his prostate. He continued to have gross hematuria following his stone removal. As such, I advised him to have his stent removed in the OR and simultaneous resection of the dystrophic calcifications.  After reviewing the management options for treatment, he elected to proceed with the above surgical procedure(s). We have discussed the potential benefits and risks of the procedure, side effects of the proposed treatment, the likelihood of the patient achieving the goals of the procedure, and any potential problems that might occur during the procedure or recuperation. Informed consent has been obtained.  Description of procedure:  The patient was taken to the operating room and general anesthesia was induced.  The patient was placed in the dorsal lithotomy position, prepped and draped in the usual sterile fashion, and preoperative antibiotics were administered. A preoperative time-out was performed.   A 30 22 French rigid cystoscope was then gently passed to the patient's urethra into the bladder. A 360 cystoscopic evaluation was  performed with no unusual or abnormal findings. The bladder mucosa was normal, there were no stones, there was no tumors, ureteral orifices were orthotopic. The stent emanating from the right ureteral orifice was grabbed at the distal aspect and pulled to the urethral meatus using stent graspers. Stent was then completely removed from the patient. At this point the 3426 French sheath was inserted using the visual obturator. Tray was exchanged for a bipolar loop and the areas of dystrophic calcification along the posterior prostatic urethra and right prostatic urethra were resected. Hemostasis was then achieved. A 20 French three-way Foley catheter was then inserted into the patient's urethra.  The patient was subsequently awoken and returned the PACU in excellent condition. There were no immediate complications.  Crist FatBenjamin W. [name], M.D.

## 2014-11-28 NOTE — Transfer of Care (Signed)
Immediate Anesthesia Transfer of Care Note  Patient: Peter Jackson  Procedure(s) Performed: Procedure(s):  STENT REMOVAL (Right) TRANSURETHRAL RESECTION OF THE PROSTATE WITH GYRUS INSTRUMENTS (N/A)  Patient Location: PACU  Anesthesia Type:General  Level of Consciousness: awake, alert , oriented and patient cooperative  Airway & Oxygen Therapy: Patient Spontanous Breathing and Patient connected to nasal cannula oxygen  Post-op Assessment: Report given to PACU RN and Post -op Vital signs reviewed and stable  Post vital signs: Reviewed and stable  Complications: No apparent anesthesia complications

## 2014-11-28 NOTE — Anesthesia Procedure Notes (Signed)
Procedure Name: LMA Insertion Date/Time: 11/28/2014 1:07 PM Performed by: Tyrone NineSAUVE, Mykenzi Vanzile F Pre-anesthesia Checklist: Patient identified, Timeout performed, Emergency Drugs available, Suction available and Patient being monitored Patient Re-evaluated:Patient Re-evaluated prior to inductionOxygen Delivery Method: Circle system utilized Preoxygenation: Pre-oxygenation with 100% oxygen Intubation Type: IV induction LMA: LMA inserted Number of attempts: 1 Airway Equipment and Method: Bite block Placement Confirmation: positive ETCO2 Tube secured with: Tape

## 2014-11-28 NOTE — Interval H&P Note (Signed)
History and Physical Interval Note: PAtient is s/p right PCNL.  He toleratered this procedure well.  He had some dystrophic calcifcations on his prostate that were noted during his PCNL.  The plan is to remove his stent today and remove the dystrophic calcifications.  No changes to his medical history. 11/28/2014 4:45 AM  Peter MuckleWilliam A Jackson  has presented today for surgery, with the diagnosis of PROSTATIC BLEEDING  The various methods of treatment have been discussed with the patient and family. After consideration of risks, benefits and other options for treatment, the patient has consented to  Procedure(s):  STENT REMOVAL (N/A) TRANSURETHRAL RESECTION OF THE PROSTATE WITH GYRUS INSTRUMENTS (N/A) as a surgical intervention .  The patient's history has been reviewed, patient examined, no change in status, stable for surgery.  I have reviewed the patient's chart and labs.  Questions were answered to the patient's satisfaction.     Peter Jackson

## 2014-11-28 NOTE — Anesthesia Postprocedure Evaluation (Signed)
  Anesthesia Post-op Note  Patient: Peter MuckleWilliam A Jackson  Procedure(s) Performed: Procedure(s) (LRB):  STENT REMOVAL (Right) TRANSURETHRAL RESECTION OF THE PROSTATE WITH GYRUS INSTRUMENTS (N/A)  Patient Location: PACU  Anesthesia Type: General  Level of Consciousness: awake and alert   Airway and Oxygen Therapy: Patient Spontanous Breathing  Post-op Pain: mild  Post-op Assessment: Post-op Vital signs reviewed, Patient's Cardiovascular Status Stable, Respiratory Function Stable, Patent Airway and No signs of Nausea or vomiting  Last Vitals:  Filed Vitals:   11/28/14 1255  BP: 127/72  Pulse: 61  Temp: 36.6 C  Resp: 12    Post-op Vital Signs: stable   Complications: No apparent anesthesia complications

## 2014-11-28 NOTE — H&P (View-Only) (Signed)
Urology Inpatient Progress Report S/p right PCNL, POD#1  Intv/Subj: Foley occluded this AM, 1200cc drained immediately, blood tinged. No real complaints of pain. Creatinine elevated this AM.  Past Medical History  Diagnosis Date  . Renal stone BILATERAL  . Bladder stones   . History of kidney stones   . History of diverticulitis of colon   . Urgency of urination   . Dysuria   . Hypertension     not on medicationin 2-3 years   . Cancer     prostate cancer    Current Facility-Administered Medications  Medication Dose Route Frequency Provider Last Rate Last Dose  . 0.9 %  sodium chloride infusion   Intravenous Continuous Peter Jackson Kirby, MD 100 mL/hr at 11/17/14 0134    . cyclobenzaprine (FLEXERIL) tablet 10 mg  10 mg Oral Daily Peter Jackson Kirby, MD      . diphenhydrAMINE (BENADRYL) injection 12.5 mg  12.5 mg Intravenous Q6H PRN Peter Jackson Kirby, MD       Or  . diphenhydrAMINE (BENADRYL) 12.5 MG/5ML elixir 12.5 mg  12.5 mg Oral Q6H PRN Peter Jackson Kirby, MD      . indapamide (LOZOL) tablet 2.5 mg  2.5 mg Oral Daily Peter Jackson Kirby, MD      . morphine 2 MG/ML injection 2-4 mg  2-4 mg Intravenous Q2H PRN Peter Jackson Kirby, MD   2 mg at 11/16/14 1411  . ondansetron (ZOFRAN) injection 4 mg  4 mg Intravenous Q4H PRN Peter Jackson Kirby, MD   4 mg at 11/16/14 1511  . oxyCODONE-acetaminophen (PERCOCET/ROXICET) 5-325 MG per tablet 1-2 tablet  1-2 tablet Oral Q4H PRN Peter Jackson Kirby, MD   2 tablet at 11/17/14 0913  . senna-docusate (Senokot-S) tablet 2 tablet  2 tablet Oral QHS Peter Jackson Kirby, MD   2 tablet at 11/16/14 2108     Objective: Vital: Filed Vitals:   11/16/14 1330 11/16/14 1351 11/16/14 2156 11/17/14 0300  BP: 101/55 108/54 95/42 102/56  Pulse: 61 69 85 73  Temp: 98 F (36.7 C) 97.5 F (36.4 C) 98.5 F (36.9 C) 98.8 F (37.1 C)  TempSrc:   Oral Oral  Resp: 15 15 16 18  Height:      Weight:      SpO2: 96% 97% 93% 92%   I/Os: I/O last 3 completed shifts: In: 1360 [P.O.:360; I.V.:1000] Out:  2300 [Urine:2100; Blood:200]  Physical Exam:  General: Patient is in no apparent distress Lungs: Normal respiratory effort, chest expands symmetrically. GI: The abdomen is soft and nontender without mass. Dressings c/d/i Ext: lower extremities symmetric  Lab Results:  Recent Labs  11/17/14 0431  WBC 17.8*  HGB 14.3  HCT 42.2    Recent Labs  11/17/14 0431  NA 136*  K 3.6*  CL 94*  CO2 26  GLUCOSE 155*  BUN 29*  CREATININE 1.90*  CALCIUM 8.9   No results for input(s): LABPT, INR in the last 72 hours. No results for input(s): LABURIN in the last 72 hours. Results for orders placed or performed during the hospital encounter of 11/06/14  Urine culture     Status: None   Collection Time: 11/06/14  2:47 PM  Result Value Ref Range Status   Specimen Description URINE, RANDOM  Final   Special Requests NONE  Final   Culture  Setup Time   Final    11/06/2014 22:50 Performed at [facility]    Colony Count   Final    >=100,000 COLONIES/ML   [facility]    Culture   Final    Multiple bacterial morphotypes present, none predominant. Suggest appropriate recollection if clinically indicated. [facility]    Report Status 11/07/2014 FINAL  Final    Studies/Results: CT scan this AM showed a punctate calcification in the right lower pole, but otherwise no stones in upper tract.  Possibly some stone fragments in the bladder.  Assessment: 1 Day Post-Op doing ok. Elevated kidney function today - hopefully transient and related to an occluded catheter. Small punctate calcification within the lower pole of the right kidney but otherwise stone free  Plan: Keep catheter this AM Repeat BMP this PM Potential d/c this PM.  Crist FatHERRICK, Peter Jackson 11/17/2014, 10:32 AM

## 2014-12-03 ENCOUNTER — Encounter (HOSPITAL_BASED_OUTPATIENT_CLINIC_OR_DEPARTMENT_OTHER): Payer: Self-pay | Admitting: Urology

## 2020-09-30 ENCOUNTER — Other Ambulatory Visit: Payer: Self-pay | Admitting: Urology

## 2020-10-09 NOTE — Progress Notes (Signed)
Attempted to contact patient regarding ESWL scheduled for 10/14/2020. No answer. Phone numbers did not go to a voice mail.

## 2020-10-10 ENCOUNTER — Other Ambulatory Visit (HOSPITAL_COMMUNITY)
Admission: RE | Admit: 2020-10-10 | Discharge: 2020-10-10 | Disposition: A | Discharge status: Home / Self Care | Payer: Medicare HMO | Source: Ambulatory Visit | Attending: Urology | Admitting: Urology

## 2020-10-10 DIAGNOSIS — Z20822 Contact with and (suspected) exposure to covid-19: Secondary | ICD-10-CM | POA: Insufficient documentation

## 2020-10-10 DIAGNOSIS — Z01812 Encounter for preprocedural laboratory examination: Secondary | ICD-10-CM | POA: Diagnosis present

## 2020-10-10 LAB — SARS CORONAVIRUS 2 (TAT 6-24 HRS): SARS Coronavirus 2: NEGATIVE

## 2020-10-10 NOTE — Progress Notes (Signed)
Returned call. Patient to arrive at 0915 on 10/14/2020. History and medications reviewed. Pre-procedure instructions given. NPO after MN Sunday except for clear liquids until 0715. Driver secured.

## 2020-10-10 NOTE — Progress Notes (Signed)
Attempt to contact patient without success. No voicemail.

## 2020-10-10 NOTE — Progress Notes (Signed)
Obtained different phone number for patient from [facility]. No answer, but able to leave voicemail.

## 2020-10-14 ENCOUNTER — Ambulatory Visit (HOSPITAL_BASED_OUTPATIENT_CLINIC_OR_DEPARTMENT_OTHER)
Admission: RE | Admit: 2020-10-14 | Discharge: 2020-10-14 | Disposition: A | Discharge status: Home / Self Care | Payer: Medicare HMO | Attending: Urology | Admitting: Urology

## 2020-10-14 ENCOUNTER — Encounter (HOSPITAL_BASED_OUTPATIENT_CLINIC_OR_DEPARTMENT_OTHER): Payer: Self-pay | Admitting: Urology

## 2020-10-14 ENCOUNTER — Other Ambulatory Visit: Payer: Self-pay

## 2020-10-14 ENCOUNTER — Encounter (HOSPITAL_BASED_OUTPATIENT_CLINIC_OR_DEPARTMENT_OTHER)
Admission: RE | Disposition: A | Discharge status: Home / Self Care | Payer: Self-pay | Source: Home / Self Care | Attending: Urology

## 2020-10-14 ENCOUNTER — Ambulatory Visit (HOSPITAL_COMMUNITY): Payer: Medicare HMO

## 2020-10-14 DIAGNOSIS — Z87891 Personal history of nicotine dependence: Secondary | ICD-10-CM | POA: Diagnosis not present

## 2020-10-14 DIAGNOSIS — I714 Abdominal aortic aneurysm, without rupture: Secondary | ICD-10-CM | POA: Insufficient documentation

## 2020-10-14 DIAGNOSIS — I10 Essential (primary) hypertension: Secondary | ICD-10-CM | POA: Insufficient documentation

## 2020-10-14 DIAGNOSIS — Z881 Allergy status to other antibiotic agents status: Secondary | ICD-10-CM | POA: Insufficient documentation

## 2020-10-14 DIAGNOSIS — Z9049 Acquired absence of other specified parts of digestive tract: Secondary | ICD-10-CM | POA: Diagnosis not present

## 2020-10-14 DIAGNOSIS — N200 Calculus of kidney: Secondary | ICD-10-CM | POA: Insufficient documentation

## 2020-10-14 DIAGNOSIS — Z8546 Personal history of malignant neoplasm of prostate: Secondary | ICD-10-CM | POA: Insufficient documentation

## 2020-10-14 DIAGNOSIS — Z79899 Other long term (current) drug therapy: Secondary | ICD-10-CM | POA: Diagnosis not present

## 2020-10-14 HISTORY — PX: EXTRACORPOREAL SHOCK WAVE LITHOTRIPSY: SHX1557

## 2020-10-14 SURGERY — LITHOTRIPSY, ESWL
Anesthesia: LOCAL | Laterality: Left

## 2020-10-14 MED ORDER — DIPHENHYDRAMINE HCL 25 MG PO CAPS
25.0000 mg | ORAL_CAPSULE | ORAL | Status: AC
  Administered 2020-10-14: 25 mg via ORAL

## 2020-10-14 MED ORDER — SODIUM CHLORIDE 0.9 % IV SOLN: INTRAVENOUS | Status: DC

## 2020-10-14 MED ORDER — DIAZEPAM 5 MG PO TABS
ORAL_TABLET | ORAL | Status: AC
  Filled 2020-10-14: qty 2

## 2020-10-14 MED ORDER — DIAZEPAM 5 MG PO TABS
10.0000 mg | ORAL_TABLET | ORAL | Status: AC
  Administered 2020-10-14: 10 mg via ORAL

## 2020-10-14 MED ORDER — CIPROFLOXACIN HCL 500 MG PO TABS
500.0000 mg | ORAL_TABLET | ORAL | Status: AC
  Administered 2020-10-14: 500 mg via ORAL

## 2020-10-14 MED ORDER — DIPHENHYDRAMINE HCL 25 MG PO CAPS
ORAL_CAPSULE | ORAL | Status: AC
  Filled 2020-10-14: qty 1

## 2020-10-14 MED ORDER — CIPROFLOXACIN HCL 500 MG PO TABS
ORAL_TABLET | ORAL | Status: AC
  Filled 2020-10-14: qty 1

## 2020-10-14 NOTE — Interval H&P Note (Signed)
History and Physical Interval Note:  10/14/2020 11:26 AM  Peter Jackson  has presented today for surgery, with the diagnosis of LEFT RENAL STONE.  The various methods of treatment have been discussed with the patient and family. After consideration of risks, benefits and other options for treatment, the patient has consented to  Procedure(s): LEFT EXTRACORPOREAL SHOCK WAVE LITHOTRIPSY (ESWL) (Left) as a surgical intervention.  The patient's history has been reviewed, patient examined, no change in status, stable for surgery.  I have reviewed the patient's chart and labs.  Questions were answered to the patient's satisfaction.     Bertram Millard Hector Venne

## 2020-10-14 NOTE — Op Note (Signed)
See [facility] OP note scanned into chart. 

## 2020-10-14 NOTE — Discharge Instructions (Signed)
See [facility] discharge instructions in chart.  

## 2020-10-14 NOTE — H&P (Signed)
H&P  Chief Complaint:  Lt kidney stone  History of Present Illness: 74 yo male presents for ESL for mgmt of a growing LT interpolar stone.  Past Medical History:  Diagnosis Date  . Aneurysm of infrarenal abdominal aorta    3 cm ;  per CT 11-17-2014  . At risk for sleep apnea    STOP-BANG= 6     SENT TO PCP 11-22-2014  . Diverticulosis of colon   . Dysuria   . History of diverticulitis of colon   . History of kidney stones   . History of prostate cancer monitored-- by dr Brunilda Payor   2005--  no tx needed---  no recurrence  . Hypertension    not on medicationin 2-3 years   . Nephrolithiasis    left  . Prostatic hemorrhage   . Urgency of urination     Past Surgical History:  Procedure Laterality Date  . CYSTOSCOPY  12/18/2011   Procedure: CYSTOSCOPY;  Surgeon: Lindaann Slough, MD;  Location: [facility];  Service: Urology;  Laterality: N/A;  HOLMIUM LASER OF BLADDER STONE  . CYSTOSCOPY W/ URETERAL STENT REMOVAL Right 11/28/2014   Procedure:  STENT REMOVAL;  Surgeon: Crist Fat, MD;  Location: [facility];  Service: Urology;  Laterality: Right;  . HEMICOLECTOMY  01-30-2008   DIVERTICULITIS  . MULTIPLE BILATERAL URETEROSCOPIC STONES EXTRACTIONS  LAST ONE 2007  . MULTIPLE CYSTO/ EHL AND EXTRACTION OF BLADDER STONES  LAST ONE 03-02-2008  . NEPHROLITHOTOMY Right 11/16/2014   Procedure: RIGHT NEPHROLITHOTOMY PERCUTANEOUS WITH SURGEON ACCESS, DOUBLE J STENT;  Surgeon: Crist Fat, MD;  Location: [facility];  Service: Urology;  Laterality: Right;  . PERCUTANEOUS NEPHROSTOLITHOTOMY Bilateral 2011    [facility]  . TONSILLECTOMY AND ADENOIDECTOMY    . TRANSURETHRAL RESECTION OF PROSTATE  MARCH 2006   W/ BLADDER STONE EXTRACTION  . TRANSURETHRAL RESECTION OF PROSTATE N/A 11/28/2014   Procedure: TRANSURETHRAL RESECTION OF THE PROSTATE WITH GYRUS INSTRUMENTS;  Surgeon: Crist Fat, MD;  Location: [facility];  Service: Urology;   Laterality: N/A;    Home Medications:  Allergies as of 10/14/2020      Reactions   Cephalexin Hives, Swelling      Medication List    Notice   Cannot display discharge medications because the patient has not yet been admitted.     Allergies:  Allergies  Allergen Reactions  . Cephalexin Hives and Swelling    No family history on file.  Social History:  reports that he quit smoking about 10 years ago. His smoking use included cigarettes. He has a 70.00 pack-year smoking history. He has never used smokeless tobacco. He reports that he does not drink alcohol and does not use drugs.  ROS: A complete review of systems was performed.  All systems are negative except for pertinent findings as noted.  Physical Exam:  Vital signs in last 24 hours: There were no vitals taken for this visit. Constitutional:  Alert and oriented, No acute distress Cardiovascular: Regular rate  Respiratory: Normal respiratory effort. Lymphatic: No lymphadenopathy Neurologic: Grossly intact, no focal deficits Psychiatric: Normal mood and affect   No results found for this or any previous visit (from the past 24 hour(s)). Recent Results (from the past 240 hour(s))  SARS CORONAVIRUS 2 (TAT 6-24 HRS) Nasopharyngeal Nasopharyngeal Swab     Status: None   Collection Time: 10/10/20  8:33 AM   Specimen: Nasopharyngeal Swab  Result Value Ref Range Status   SARS  Coronavirus 2 NEGATIVE NEGATIVE Final    Comment: (NOTE) SARS-CoV-2 target nucleic acids are NOT DETECTED.  The SARS-CoV-2 RNA is generally detectable in upper and lower respiratory specimens during the acute phase of infection. Negative results do not preclude SARS-CoV-2 infection, do not rule out co-infections with other pathogens, and should not be used as the sole basis for treatment or other patient management decisions. Negative results must be combined with clinical observations, patient history, and epidemiological information. The  expected result is Negative.  Fact Sheet for Patients: HairSlick.no  Fact Sheet for Healthcare Providers: quierodirigir.com  This test is not yet approved or cleared by the [place] FDA and  has been authorized for detection and/or diagnosis of SARS-CoV-2 by FDA under an Emergency Use Authorization (EUA). This EUA will remain  in effect (meaning this test can be used) for the duration of the COVID-19 declaration under Se ction 564(b)(1) of the Act, 21 U.S.C. section 360bbb-3(b)(1), unless the authorization is terminated or revoked sooner.  Performed at [facility], [address] [place], [place] [address]     Renal Function: No results for input(s): CREATININE in the last 168 hours. CrCl cannot be calculated (Patient's most recent lab result is older than the maximum 21 days allowed.).  Radiologic Imaging: No results found.  Impression/Assessment:  Lt renal stone  Plan:  Lt ESL--pt knows that this may be the first part of a staged procedure

## 2020-10-15 ENCOUNTER — Encounter (HOSPITAL_BASED_OUTPATIENT_CLINIC_OR_DEPARTMENT_OTHER): Payer: Self-pay | Admitting: Urology

## 2023-11-07 DEATH — deceased
